# Patient Record
Sex: Female | Born: 1940 | Race: White | Hispanic: No | Smoking: Never smoker
Health system: Southern US, Community
[De-identification: ages and names within clinical notes are randomized; demographics above are authoritative.]

## PROBLEM LIST (undated history)

## (undated) DIAGNOSIS — K219 Gastro-esophageal reflux disease without esophagitis: Secondary | ICD-10-CM

## (undated) DIAGNOSIS — M109 Gout, unspecified: Secondary | ICD-10-CM

## (undated) DIAGNOSIS — E785 Hyperlipidemia, unspecified: Secondary | ICD-10-CM

## (undated) DIAGNOSIS — J45909 Unspecified asthma, uncomplicated: Secondary | ICD-10-CM

## (undated) DIAGNOSIS — I10 Essential (primary) hypertension: Secondary | ICD-10-CM

## (undated) HISTORY — PX: ABDOMINAL HYSTERECTOMY: SHX81

---

## 2004-08-19 ENCOUNTER — Ambulatory Visit: Payer: Self-pay | Admitting: Internal Medicine

## 2005-08-31 ENCOUNTER — Ambulatory Visit: Payer: Self-pay | Admitting: Internal Medicine

## 2006-09-01 ENCOUNTER — Ambulatory Visit: Payer: Self-pay | Admitting: Internal Medicine

## 2007-01-16 ENCOUNTER — Ambulatory Visit: Payer: Self-pay | Admitting: Unknown Physician Specialty

## 2008-01-01 ENCOUNTER — Ambulatory Visit: Payer: Self-pay | Admitting: Internal Medicine

## 2008-10-17 ENCOUNTER — Ambulatory Visit: Payer: Self-pay | Admitting: Internal Medicine

## 2009-01-06 ENCOUNTER — Ambulatory Visit: Payer: Self-pay | Admitting: Internal Medicine

## 2009-09-25 ENCOUNTER — Ambulatory Visit: Payer: Self-pay

## 2009-10-07 ENCOUNTER — Ambulatory Visit: Payer: Self-pay

## 2010-01-01 ENCOUNTER — Ambulatory Visit: Payer: Self-pay | Admitting: Internal Medicine

## 2010-01-18 ENCOUNTER — Inpatient Hospital Stay: Payer: Self-pay | Admitting: Internal Medicine

## 2010-01-26 ENCOUNTER — Ambulatory Visit: Payer: Self-pay | Admitting: Internal Medicine

## 2010-06-18 ENCOUNTER — Ambulatory Visit: Payer: Self-pay | Admitting: Family Medicine

## 2010-07-10 ENCOUNTER — Emergency Department: Payer: Self-pay | Admitting: Emergency Medicine

## 2011-03-08 ENCOUNTER — Ambulatory Visit: Payer: Self-pay | Admitting: Family Medicine

## 2012-04-05 ENCOUNTER — Ambulatory Visit: Payer: Self-pay | Admitting: Family Medicine

## 2012-07-25 LAB — COMPREHENSIVE METABOLIC PANEL
Anion Gap: 12 (ref 7–16)
BUN: 20 mg/dL — ABNORMAL HIGH (ref 7–18)
Bilirubin,Total: 0.5 mg/dL (ref 0.2–1.0)
Chloride: 102 mmol/L (ref 98–107)
Co2: 23 mmol/L (ref 21–32)
Creatinine: 1.13 mg/dL (ref 0.60–1.30)
EGFR (African American): 57 — ABNORMAL LOW
EGFR (Non-African Amer.): 49 — ABNORMAL LOW
Osmolality: 279 (ref 275–301)
Potassium: 3.3 mmol/L — ABNORMAL LOW (ref 3.5–5.1)
SGPT (ALT): 63 U/L (ref 12–78)
Sodium: 137 mmol/L (ref 136–145)
Total Protein: 8.1 g/dL (ref 6.4–8.2)

## 2012-07-25 LAB — PRO B NATRIURETIC PEPTIDE: B-Type Natriuretic Peptide: 906 pg/mL — ABNORMAL HIGH (ref 0–125)

## 2012-07-25 LAB — CK TOTAL AND CKMB (NOT AT ARMC): CK, Total: 72 U/L (ref 21–215)

## 2012-07-25 LAB — URINALYSIS, COMPLETE
Hyaline Cast: 3
Ketone: NEGATIVE
Leukocyte Esterase: NEGATIVE
Nitrite: NEGATIVE
Ph: 5 (ref 4.5–8.0)
Protein: NEGATIVE
Specific Gravity: 1.011 (ref 1.003–1.030)
WBC UR: 4 /HPF (ref 0–5)

## 2012-07-25 LAB — CBC
HGB: 14.2 g/dL (ref 12.0–16.0)
Platelet: 146 10*3/uL — ABNORMAL LOW (ref 150–440)
RBC: 4.63 10*6/uL (ref 3.80–5.20)

## 2012-07-25 LAB — TSH: Thyroid Stimulating Horm: 1.35 u[IU]/mL

## 2012-07-25 LAB — TROPONIN I: Troponin-I: <0.02 ng/mL

## 2012-07-26 ENCOUNTER — Inpatient Hospital Stay: Payer: Self-pay | Admitting: Internal Medicine

## 2012-07-26 LAB — CBC WITH DIFFERENTIAL/PLATELET
Basophil #: 0 10*3/uL (ref 0.0–0.1)
Eosinophil #: 0.2 10*3/uL (ref 0.0–0.7)
HCT: 37.9 % (ref 35.0–47.0)
HGB: 13.1 g/dL (ref 12.0–16.0)
Lymphocyte #: 0.3 10*3/uL — ABNORMAL LOW (ref 1.0–3.6)
Lymphocyte %: 2 %
MCH: 30.5 pg (ref 26.0–34.0)
MCHC: 34.4 g/dL (ref 32.0–36.0)
MCV: 89 fL (ref 80–100)
Monocyte #: 0.7 x10 3/mm (ref 0.2–0.9)
Neutrophil #: 13.9 10*3/uL — ABNORMAL HIGH (ref 1.4–6.5)
Neutrophil %: 92.1 %

## 2012-07-26 LAB — COMPREHENSIVE METABOLIC PANEL
Albumin: 3.3 g/dL — ABNORMAL LOW (ref 3.4–5.0)
Anion Gap: 9 (ref 7–16)
BUN: 18 mg/dL (ref 7–18)
Bilirubin,Total: 0.4 mg/dL (ref 0.2–1.0)
Chloride: 103 mmol/L (ref 98–107)
Co2: 24 mmol/L (ref 21–32)
Creatinine: 1.09 mg/dL (ref 0.60–1.30)
EGFR (African American): 59 — ABNORMAL LOW
EGFR (Non-African Amer.): 51 — ABNORMAL LOW
Osmolality: 278 (ref 275–301)
Potassium: 3.6 mmol/L (ref 3.5–5.1)
SGPT (ALT): 48 U/L (ref 12–78)
Sodium: 136 mmol/L (ref 136–145)
Total Protein: 7.5 g/dL (ref 6.4–8.2)

## 2012-07-26 LAB — RAPID INFLUENZA A&B ANTIGENS

## 2012-07-26 LAB — TROPONIN I: Troponin-I: <0.02 ng/mL

## 2012-07-26 LAB — CK TOTAL AND CKMB (NOT AT ARMC)
CK, Total: 69 U/L (ref 21–215)
CK-MB: <0.5 ng/mL — ABNORMAL LOW (ref 0.5–3.6)

## 2012-08-01 LAB — CULTURE, BLOOD (SINGLE)

## 2013-04-12 ENCOUNTER — Ambulatory Visit: Payer: Self-pay | Admitting: Family Medicine

## 2013-05-11 ENCOUNTER — Ambulatory Visit: Payer: Self-pay | Admitting: Family Medicine

## 2013-11-29 ENCOUNTER — Ambulatory Visit: Payer: Self-pay | Admitting: Family Medicine

## 2014-05-06 ENCOUNTER — Ambulatory Visit: Payer: Self-pay | Admitting: Family Medicine

## 2014-07-18 ENCOUNTER — Ambulatory Visit: Payer: Self-pay | Admitting: Physician Assistant

## 2014-11-26 NOTE — Consult Note (Signed)
General Aspect 74 yo female with history of hypertension and hyperlipidemia and reactive airway disease with gerd and hiatal hernia. She was admitted after developping rather sudden onset shortness of breath and palpitatioins. She presented to the er where she was noted to have afib with rvr. She converted to sinus thryhm. She urled out for an mi with normal troponin. Her bnp was mildly elevated to 950 ranges. Her chest xray dis not reval any evidence of volumer overload and was mildly hyperexpanded. She improved with medical therapy. Echo revealed preserved lv funciton with mild pulmonary hyupertension. She is not aware of sleep apnea but her freind states that she snores on occasion. She denis chest pain. She denies prioir afib.   Physical Exam:   GEN no acute distress    HEENT PERRL, hearing intact to voice    NECK supple  No masses    RESP normal resp effort  clear BS    CARD Regular rate and rhythm  No murmur    ABD denies tenderness  normal BS    LYMPH negative neck, negative axillae    EXTR negative cyanosis/clubbing, negative edema    SKIN normal to palpation    NEURO cranial nerves intact, motor/sensory function intact    PSYCH A+O to time, place, person   Review of Systems:   Subjective/Chief Complaint shortenss of breath    General: No Complaints    Skin: No Complaints    ENT: No Complaints    Eyes: No Complaints    Neck: No Complaints    Respiratory: Short of breath    Cardiovascular: No Complaints    Gastrointestinal: No Complaints    Genitourinary: No Complaints    Vascular: No Complaints    Musculoskeletal: No Complaints    Neurologic: No Complaints    Hematologic: No Complaints    Endocrine: No Complaints    Psychiatric: No Complaints    Review of Systems: All other systems were reviewed and found to be negative    Medications/Allergies Reviewed Medications/Allergies reviewed   Home Medications: Medication Instructions Status  K-Tab  10 mEq oral tablet, extended release 1 tab(s) orally once a day Active  furosemide 20 mg oral tablet 1 tab(s) orally once a day Active  levofloxacin 250 mg oral tablet 1 tab(s) orally once a day Active  Combivent Respimat CFC free 100 mcg-20 mcg/inh inhalation aerosol 1 puff(s) inhaled 4 times a day Active  budesonide-formoterol 160 mcg-4.5 mcg/inh inhalation aerosol 2  inhaled in the morning and at bedtime Active  metoprolol tartrate 25 mg oral tablet 1 tab(s) orally 2 times a day Active  lisinopril 2.5 mg oral tablet 1 tab(s) orally once a day Active  allopurinol 100 mg oral tablet 1 tab(s) orally once a day Active  colchicine 0.6 mg oral tablet 1 tab(s) orally once a day Active  calcium-vitamin D 500 mg-200 intl units oral tablet 1 tab(s) orally once a day Active  Centrum Silver oral tablet 1 tab(s) orally once a day Active  aspirin 81 mg oral tablet 1 tab(s) orally once a day Active  pravastatin 10 mg oral tablet 1 tab(s) orally once a day  Active  vitamin E 400 intl units oral capsule 1 cap(s) orally once a day  Active   EKG:   EKG NSR    Abnormal NSSTTW changes    penicillins: Swelling, Rash    Impression Pt with history of hypertension, hyperlipidemia admitted iwth transient afib with rvr. Etiology unclear. No evidence of left sided  failure on cxr and echo revealed preserved lv function. Mild right sided pressure increase which may be idopathic vs secondary to sleep apnea vs reactive airway disease. Appears stable at present with no evidence of chf. OK for discharge on current meds.    Plan 1. Continue current meds. 2Low fat, low sodiuim diet. 3. OK for discharge.  4. Will be glad to follow up as outpatient if desired.   Electronic Signatures: Dalia Heading (MD)  (Signed 19-Dec-13 15:36)  Authored: General Aspect/Present Illness, History and Physical Exam, Review of System, Home Medications, EKG , Allergies, Impression/Plan   Last Updated: 19-Dec-13 15:36 by Dalia Heading (MD)

## 2014-11-29 NOTE — Discharge Summary (Signed)
PATIENT NAME:  Laurie Adkins, Laurie Adkins MR#:  [id number] DATE OF BIRTH:  Nov 25, 1940  DATE OF ADMISSION:  07/26/2012 DATE OF DISCHARGE:  07/27/2012  ADMITTING DIAGNOSIS: Dyspnea.   DISCHARGE DIAGNOSES:  1. Acute respiratory distress due to asthma exacerbation.  2. Acute bronchitis.  3. Congestive heart failure, acute, diastolic.  4. Atrial fibrillation/rapid ventricular response converted to sinus rhythm now.  5. Leukocytosis.  6. Diabetes mellitus, hemoglobin A1c 6.5, new onset.  7. Hypertension.  8. Hyperlipidemia. 9. Gout.   DISCHARGE CONDITION: Stable.   DISCHARGE MEDICATIONS: The patient is to continue: 1. Pravastatin 10 mg p.o. daily.  2. Vitamin E 400 units daily.  3. Allopurinol 100 mg p.o. daily.  4. Colchicine 0.6 mg p.o. daily.  5. Calcium with vitamin D 500/200 one tablet once daily. 6. Centrum Silver once daily.  7. Aspirin 81 mg a day.  8. New medication: Lisinopril 2.5 mg p.o. daily.  9. Metoprolol tartrate 25 mg p.o. twice daily.  10. Budesonide formoterol 160/4.5 mg 2 inhalations twice daily.  11. Combivent Respimat 1 puff 4 times daily.  12. Levofloxacin 250 mg p.o. daily. The patient'Adkins levofloxacin will be for the next 4 days.  13. Furosemide 20 mg p.o. daily.  14. Potassium chloride 10 mEq p.o. daily.  15. The patient is not to take hydrochlorothiazide.  DIET: Two grams salt, low fat, low cholesterol, carbohydrate controlled diet, regular consistency.   ACTIVITY LIMITATIONS: As tolerated.   FOLLOWUP: Follow-up appointment with Dr. Gavin PottersGrandis in two days after discharge, as well as Dr. Lady GaryFath in two weeks after discharge.   CONSULTANTS: Dr. Lady GaryFath, Care Management.   RADIOLOGIC STUDIES: Chest x-ray PA and lateral on 07/25/2012 revealed no acute cardiopulmonary disease, hyperinflation consistent with COPD was noted. CT of chest with IV contrast for pulmonary embolism on 07/25/2012 showed no evidence of pulmonary embolus, otherwise unremarkable. Abdominal ultrasound  07/25/2012 showed no cholelithiasis with sonographic evidence of acute cholecystitis noted. Echocardiogram done on the 07/26/2012 showed left ventricular systolic function normal, ejection fraction equal or more than 55%, left atrial size is normal, right atrial size is normal, right ventricle was grossly normal size, there is trace tricuspid regurgitation, there is trace mitral regurgitation, aortic valve opens well, right ventricular systolic pressure is elevated at 30 to 40 mmHg.  HISTORY OF PRESENT ILLNESS: The patient is a 74 year old Caucasian female with past medical history significant for history of remote asthma presenting to the hospital with complaints of shortness of breath. Please refer to Dr. Arlys JohnVaickute'Adkins admission on 07/25/2012. On arrival to the hospital, the patient'Adkins vitals revealed a temperature of 97.9, pulse 105, pulse  was 72, blood pressure was 144/50, saturation was 90% on room air. Physical exam was unremarkable except for diminished breath sounds bilaterally especially posteriorly. Poor air entrance was noted to be bilaterally, but no significant wheezing was noted on auscultation. Otherwise, unremarkable physical exam. The patient'Adkins lab data on 07/25/2012 showed glucose of 148, BUN of 20, potassium 3.3. Otherwise BMP was unremarkable. The patient'Adkins magnesium level was found to be low at 1.4. Hemoglobin A1c was found to be 6.5. Beta natriuretic peptide was 906. Liver enzymes revealed a mildly elevated AST, 50  and alkaline phosphatase was elevated to 140. Cardiac enzymes x 1 as well as subsequent two more sets were within normal limits. TSH was normal at 1.35. White blood cell count was elevated to 16.0, hemoglobin was 14.2, platelet count 146. D-dimer was elevated at 1.21. Blood cultures taken on 07/26/2012 showed no growth. Influenza test was also  negative. Urine analysis was unremarkable except 4 white blood cells and 2 red blood cells were noted. No epithelial cells, no leukocyte  esterase or nitrites were noted. EKG showed sinus tachy at 104 beats per minute, left atrial enlargement, possible anterior infarct age undetermined. No acute ST-T changes were noted.   HOSPITAL COURSE: The patient was admitted to the hospital. She was started on inhalation therapy initially and diuretics as well as ACE inhibitor because of concern of combination of COPD versus CHF exacerbation. With this therapy, she improved; however, on 07/26/2012, she developed high fevers. Blood cultures taken during her high fever period were negative. However, the patient was started on antibiotic therapy. She was continued on antibiotics while she was in the hospital and improved, her fever subsided. She is to continue antibiotics to complete a 7-day course. In regards to her dyspnea, it was felt that the patient'Adkins dyspnea was related to combination of factors. First, asthma exacerbation as well as acute bronchitis which gave patient fevers; however, they were not present during her admission. However, the patient'Adkins white blood cell count was elevated and the patient is to continue antibiotic therapy for a few more days to complete course. Her white blood cell count checked on the day of discharge was within normal limits. The patient is to continue inhalation therapy with Combivent as well as Symbicort and follow up with her primary care physician for further recommendations.   The patient developed atrial fibrillation while in the hospital with rapid ventricular response. She was started on metoprolol and she converted into sinus rhythm a few hours after atrial fibrillation started. She remained in sinus rhythm. She is to continue metoprolol and follow up with cardiologist for further recommendations. Echocardiogram was unremarkable except for elevated right-sided pressures. It was unclear why the patient right-sided pressures were elevated; however, obstructive sleep apnea could be implicated. We did CT scan of her  chest which showed no pulmonary embolism. The patient is to continue ACE inhibitor for congestive heart failure, acute, diastolic. She is also to continue metoprolol as well as diuretics. She is to follow up with cardiologist for further recommendations.   In regards to leukocytosis, it was felt to be bronchitis related. The patient is to continue antibiotic therapy for a few more days to complete course. On day of discharge, 07/27/2012, the patient'Adkins white blood cell count is back to normal.   The patient'Adkins platelet count was also found to be low. On the day of admission, the patient'Adkins platelet count was 146. On day of discharge, it was 129. It is recommended to follow the patient'Adkins platelet count as an outpatient and make sure that the patient'Adkins platelet count returns back to normal.   The patient was noted to be hyperglycemic on arrival to the hospital. The patient'Adkins hemoglobin A1c was found to be 6.5. The patient is to be evaluated by diabetic nurse. She was advised in regards to diet control as well as weight loss. She was not prescribed any p.o. medications at this point. She is recommended to return back to her primary care physician, Dr. Gavin Potters, any decisions about initiation of oral diabetic medications if needed.   For hypertension, the patient'Adkins blood pressure was satisfactorily controlled. However, as the patient'Adkins blood pressure was slightly elevated on admission, the patient was initiated on ACE inhibitors as well as beta blockers. She is to continue those medications upon discharge. On day of discharge, the patient'Adkins blood pressure fluctuates; however, it is in the lower range and  ranging from 90s to 110s. It is recommended to follow the patient'Adkins blood pressure readings and make decisions about changing her medications if needed.   For hyperlipidemia as well as gout, she is to continue her outpatient medications. The patient is being discharged in stable condition with the above-mentioned  medications and followup. On day of discharge, temperature is 97.9, pulse ranging from 70s to 80s, respiration rate was 18, blood pressure ranging from 90s to 110s, oxygen saturation 95% on room air at rest and 93% on room air on exertion.    TIME SPENT: 40 minutes.   ____________________________ Katharina Caper, MD rv:es D: 07/27/2012 16:28:00 ET T: 07/28/2012 08:17:54 ET JOB#: [id number]  cc: Letitia Caul, MD Darlin Priestly. Lady Gary, MD Katharina Caper, MD, <Dictator>   Victorino Fatzinger MD ELECTRONICALLY SIGNED 08/20/2012 13:29

## 2014-11-29 NOTE — H&P (Signed)
PATIENT NAME:  Laurie Adkins, Laurie Adkins MR#:  [id number] DATE OF BIRTH:  March 15, 1941  DATE OF ADMISSION:  07/25/2012  PRIMARY CARE PHYSICIAN:  Dr. Gavin Potters.   HISTORY OF PRESENT ILLNESS:  The patient is a 74 year old Caucasian female with a past medical history significant for history of hypertension, hyperlipidemia, history of gastroesophageal reflux disease as well as hiatal hernia, who presented to the hospital with complaints of shortness of breath. Apparently, the patient was doing well up until yesterday. She was sitting and watching TV, and suddenly started feeling as if her heart was beating really fast. She then became very short of breath. She was also complaining of some discomfort in her epigastrium. Pain was described as dull achy pain, lasts approximately 30 minutes. However, intermittently it was coming back and forth all night long. She decided to come to the Emergency Room, as today in the morning, she was still very short of breath as well as wheezing. She also developed significant diaphoresis and burst into cold sweats earlier today. Because of this painful respiration which the patient was describing to me as well as shortness of breath, she decided to come to the Emergency Room for further evaluation and hospitalist services were contacted for admission. She denies any heart problems in the past or lung problems in the past, however, admits of having asthma exacerbation approximately 17 years ago. No smoking, passive or active during all her lifetime.   PAST MEDICAL HISTORY:  Significant for history of admission in June 2011 for PENICILLIN ALLERGY, hypertension, hyperlipidemia, history of leukocytosis, gastroesophageal reflux disease, hiatal hernia, left carotid bruit.   PAST SURGICAL HISTORY: Tonsillectomy as well as adenoidectomy in 1979, vaginal hysterectomy secondary to increased bleeding.   FAMILY HISTORY: The patient's mother had history of high blood pressure, pacemaker placement, as  well as CVA. The patient's sister had breast cancer. Brother died of pancreatic cancer.  Another brother died of MI at the age of 55.   SOCIAL HISTORY: Never smoked, retired from [employer]. She is divorced. No alcohol abuse.   MEDICATIONS: Allopurinol 100 mg p.o. daily, aspirin 81 mg p.o. daily, calcium with vitamin D daily, multivitamins daily, HCTZ 25 mg p.o. daily, Pravachol 10 mg p.o. daily and vitamin E 400 units daily.   ALLERGIES: AART FROM PENICILLIN APPARENTLY INCLUDES ALSO PREDNISONE WHICH CAUSES SWELLING.   REVIEW OF SYSTEMS:  Positive for feeling cold sweats, short of breath, as well as epigastric abdominal discomfort, wheezing in her lungs, painful respirations, some lower chest pains, epigastric pains, arrhythmias, palpitations earlier today as well as dyspnea on exertion, nausea and vomiting earlier this morning as well as epigastric upper abdominal pain. The patient was  recently treated for urinary tract infection and finished therapy with nitrofurantoin. Denies any fevers, chills, fatigue, weakness, weight loss or gain.  IN REGARDS TO EYES: Denies any blurry vision, double vision, glaucoma or cataracts.  ENT: Denies any tinnitus, allergies, epistaxis, sinus pain, dentures, difficulty swallowing.  RESPIRATORY:  Denies any cough, asthma, COPD.   CARDIOVASCULAR: Denies any orthopnea, edema, palpitations or syncope.  GASTROINTESTINAL:  Denies any diarrhea, rectal bleeding, change in bowel habits.  GENITOURINARY: Denies dysuria, hematuria, frequency or incontinence.  ENDOCRINOLOGY: Denies any polydipsia, nocturia, thyroid problems, heat or cold intolerance, or thirst.  HEMATOLOGIC: Denies anemia, easy bruising, or bleeding, glands.  SKIN: Denies any acne, rashes, lesions or moles.  MUSCULOSKELETAL: Denies arthritis, cramps, swelling.  NEUROLOGIC: No numbness, epilepsy or tremor.  PSYCHIATRIC: Denies anxiety, insomnia or depression.   PHYSICAL EXAMINATION: VITAL  SIGNS: On  arrival to the hospital, temperature is 97.9, pulse 105, respirations were 72, blood pressure 144/50, saturation was 90% on room air.  GENERAL: This is a well-developed, well-nourished, mildly obese Caucasian female in no significant distress, sitting on the stretcher.  HEENT: Her pupils are equal and reactive to light. Extraocular muscles intact.  No icterus or conjunctivitis. Has normal hearing. No pharyngeal erythema. Mucosa is moist.  NECK: No masses, supple, nontender. Thyroid is not enlarged. No adenopathy. No JVD or carotid bruits bilaterally. Full range of motion.  LUNGS: Diminished breath sounds bilaterally, especially posteriorly. Poor air entrance bilaterally. However, the patient does have some wheezing I can hear from the distance.  She does have increased effort to breathe. She is tachypneic, in minimal respiratory distress I would say. She was also noted to have rales and crackles at bases bilaterally, especially posteriorly.   CARDIOVASCULAR: S1, S2 appreciated. No murmurs, gallops, or rubs noted. PMI is not enlarged. Chest is nontender to palpation.  EXTREMITIES:  Pulses 1+.  No lower extremity edema, calf tenderness or cyanosis was noted.  ABDOMEN: Soft, nontender. Bowel sounds are present. No hepatosplenomegaly or masses were noted.   RECTAL: Deferred.  MUSCULOSKELETAL: Able to move all extremities. No cyanosis, degenerative joint disease or kyphosis suggested.  Marland Kitchen.  SKIN: Did not reveal and did not reveal any rashes, lesions, erythema, nodularity, induration. It was warm and dry to palpation.  LYMPH: No lymphadenopathy in the cervical region.  NEUROLOGICAL: Cranial nerves grossly intact. Sensory is intact. No dysarthria or aphasia.  PSYCH: The patient is alert, oriented to time, person and place, cooperative. Memory is good. No recent confusion, agitation or depression noted.   LABORATORY DATA: BMP showed glucose 148, BUN was 20, potassium 3.3, otherwise BMP was unremarkable,  however, patient's estimated GFR for non-African American would be 49 which is lower than normal. The beta-natriuretic peptide was elevated at 906. Liver enzymes showed alkaline phosphatase 140, AST elevated to 50, otherwise unremarkable study. The patient's cardiac enzymes, first set is negative. White blood cell count is 16.0, hemoglobin 14.2, platelet count 146. EKG showed sinus tachycardia at 104 beats per minute, normal axis, possible left atrial enlargement, possible anterior infarct with poor RV progression in V4, no acute ST-T changes, however, were noted.   RADIOLOGIC STUDIES:  Chest x-ray, PA and lateral, on July 25, 2012, revealed no acute cardiopulmonary disease. Hyperinflation consistent with COPD noted.   ASSESSMENT AND PLAN: 1.  Dyspnea of unclear etiology at this time. Admit the patient to the medical floor. Will treat patient for possible congestive heart failure as well as chronic obstructive pulmonary disease exacerbation. Admit, will continue oxygen therapy with DuoNebs as well as Lasix at low dose, and will follow patient's clinical presentation.  2.  Hypoxia. Will continue oxygen as needed.  3.  Palpitations. Get TSH, will also check d-dimer and will get a CT scan of her chest done to rule out pulmonary embolism. Will follow on telemetry, for now, it is normal sinus rhythm on EKG.  4.  Epigastric abdominal pain, questionable acute coronary syndrome. Get Myoview in the morning.   TIME SPENT: 50 minutes.     ____________________________ Katharina Caperima Milda Lindvall, MD rv:cs D: 07/25/2012 17:48:00 ET T: 07/25/2012 18:45:43 ET JOB#: [id number]  cc: Letitia CaulHeidi M. Grandis, MD Katharina Caperima Lien Lyman, MD, <Dictator>  Xinyi Batton MD ELECTRONICALLY SIGNED 08/30/2012 13:56

## 2015-05-14 ENCOUNTER — Other Ambulatory Visit: Payer: Self-pay | Admitting: Family Medicine

## 2015-05-14 DIAGNOSIS — R945 Abnormal results of liver function studies: Secondary | ICD-10-CM

## 2015-05-14 DIAGNOSIS — Z0389 Encounter for observation for other suspected diseases and conditions ruled out: Secondary | ICD-10-CM

## 2015-05-14 DIAGNOSIS — R7989 Other specified abnormal findings of blood chemistry: Secondary | ICD-10-CM

## 2015-05-19 ENCOUNTER — Ambulatory Visit
Admission: RE | Admit: 2015-05-19 | Discharge: 2015-05-19 | Disposition: A | Discharge status: Home / Self Care | Payer: Medicare Other | Source: Ambulatory Visit | Attending: Family Medicine | Admitting: Family Medicine

## 2015-05-19 DIAGNOSIS — R7989 Other specified abnormal findings of blood chemistry: Secondary | ICD-10-CM | POA: Diagnosis present

## 2015-05-19 DIAGNOSIS — R945 Abnormal results of liver function studies: Secondary | ICD-10-CM

## 2015-05-19 DIAGNOSIS — Z0389 Encounter for observation for other suspected diseases and conditions ruled out: Secondary | ICD-10-CM | POA: Insufficient documentation

## 2015-09-24 ENCOUNTER — Other Ambulatory Visit: Payer: Self-pay | Admitting: Family Medicine

## 2015-09-24 DIAGNOSIS — R928 Other abnormal and inconclusive findings on diagnostic imaging of breast: Secondary | ICD-10-CM

## 2015-10-07 ENCOUNTER — Ambulatory Visit
Admission: EM | Admit: 2015-10-07 | Discharge: 2015-10-07 | Disposition: A | Discharge status: Home / Self Care | Payer: Medicare Other | Attending: Family Medicine | Admitting: Family Medicine

## 2015-10-07 ENCOUNTER — Encounter: Payer: Self-pay | Admitting: *Deleted

## 2015-10-07 DIAGNOSIS — J0100 Acute maxillary sinusitis, unspecified: Secondary | ICD-10-CM | POA: Diagnosis not present

## 2015-10-07 HISTORY — DX: Essential (primary) hypertension: I10

## 2015-10-07 MED ORDER — AZITHROMYCIN 250 MG PO TABS: ORAL_TABLET | ORAL | Status: DC

## 2015-10-07 NOTE — ED Notes (Signed)
Pt c/o runny nose, watery eyes, sore throat, mild non-productive cough. Past hx of seasonal allergies. Denies fever.

## 2015-10-07 NOTE — ED Provider Notes (Signed)
CSN: [id number]     Arrival date & time 10/07/15  2440 History   First MD Initiated Contact with Patient 10/07/15 1011     Chief Complaint  Patient presents with  . Nasal Congestion  . Sore Throat   (Consider location/radiation/quality/duration/timing/severity/associated sxs/prior Treatment) Patient is a 75 y.o. female presenting with pharyngitis and URI. The history is provided by the patient.  Sore Throat Associated symptoms include headaches.  URI Presenting symptoms: congestion, cough, facial pain, fatigue and fever   Severity:  Moderate Onset quality:  Sudden Duration:  2 weeks Timing:  Constant Progression:  Worsening Chronicity:  New Relieved by:  Nothing Ineffective treatments:  OTC medications Associated symptoms: headaches and sinus pain   Associated symptoms: no wheezing   Risk factors: sick contacts   Risk factors: no chronic cardiac disease, no chronic kidney disease, no chronic respiratory disease, no diabetes mellitus, no immunosuppression, no recent illness and no recent travel     Past Medical History  Diagnosis Date  . Hypertension    History reviewed. No pertinent past surgical history. History reviewed. No pertinent family history. Social History  Substance Use Topics  . Smoking status: Never Smoker   . Smokeless tobacco: None  . Alcohol Use: No   OB History    No data available     Review of Systems  Constitutional: Positive for fever and fatigue.  HENT: Positive for congestion.   Respiratory: Positive for cough. Negative for wheezing.   Neurological: Positive for headaches.    Allergies  Penicillins  Home Medications   Prior to Admission medications   Medication Sig Start Date End Date Taking? Authorizing Provider  losartan (COZAAR) 25 MG tablet Take 25 mg by mouth daily.   Yes Historical Provider, MD  pravastatin (PRAVACHOL) 40 MG tablet Take 40 mg by mouth daily.   Yes Historical Provider, MD  azithromycin (ZITHROMAX Z-PAK) 250 MG  tablet 2 tabs po once day 1, then 1 tab po qd for next 4 days 10/07/15   Payton Mccallum, MD   Meds Ordered and Administered this Visit  Medications - No data to display  BP 134/68 mmHg  Pulse 60  Temp(Src) 97.7 F (36.5 C) (Oral)  Resp 16  Ht 5' (1.524 m)  Wt 135 lb (61.236 kg)  BMI 26.37 kg/m2  SpO2 98% No data found.   Physical Exam  Constitutional: She appears well-developed and well-nourished. No distress.  HENT:  Head: Normocephalic and atraumatic.  Right Ear: Tympanic membrane, external ear and ear canal normal.  Left Ear: Tympanic membrane, external ear and ear canal normal.  Nose: Mucosal edema and rhinorrhea present. No nose lacerations, sinus tenderness, nasal deformity, septal deviation or nasal septal hematoma. No epistaxis.  No foreign bodies. Right sinus exhibits maxillary sinus tenderness and frontal sinus tenderness. Left sinus exhibits maxillary sinus tenderness and frontal sinus tenderness.  Mouth/Throat: Uvula is midline, oropharynx is clear and moist and mucous membranes are normal. No oropharyngeal exudate.  Eyes: Conjunctivae and EOM are normal. Pupils are equal, round, and reactive to light. Right eye exhibits no discharge. Left eye exhibits no discharge. No scleral icterus.  Neck: Normal range of motion. Neck supple. No thyromegaly present.  Cardiovascular: Normal rate, regular rhythm and normal heart sounds.   Pulmonary/Chest: Effort normal and breath sounds normal. No respiratory distress. She has no wheezes. She has no rales.  Lymphadenopathy:    She has no cervical adenopathy.  Skin: She is not diaphoretic.  Nursing note and vitals reviewed.  ED Course  Procedures (including critical care time)  Labs Review Labs Reviewed - No data to display  Imaging Review No results found.   Visual Acuity Review  Right Eye Distance:   Left Eye Distance:   Bilateral Distance:    Right Eye Near:   Left Eye Near:    Bilateral Near:         MDM    1. Acute maxillary sinusitis, recurrence not specified    Discharge Medication List as of 10/07/2015 10:31 AM    START taking these medications   Details  azithromycin (ZITHROMAX Z-PAK) 250 MG tablet 2 tabs po once day 1, then 1 tab po qd for next 4 days, Normal      1. diagnosis reviewed with patient 2. rx as per orders above; reviewed possible side effects, interactions, risks and benefits  3. Recommend supportive treatment with increased fluids, otc analgesics 4. Follow-up prn if symptoms worsen or don't improve    Payton Mccallum, MD 10/07/15 1101

## 2015-10-10 ENCOUNTER — Ambulatory Visit
Admission: RE | Admit: 2015-10-10 | Discharge: 2015-10-10 | Disposition: A | Discharge status: Home / Self Care | Payer: Medicare Other | Source: Ambulatory Visit | Attending: Family Medicine | Admitting: Family Medicine

## 2015-10-10 ENCOUNTER — Other Ambulatory Visit: Payer: Self-pay | Admitting: Family Medicine

## 2015-10-10 DIAGNOSIS — R928 Other abnormal and inconclusive findings on diagnostic imaging of breast: Secondary | ICD-10-CM

## 2015-10-10 DIAGNOSIS — N63 Unspecified lump in breast: Secondary | ICD-10-CM | POA: Diagnosis not present

## 2016-02-01 ENCOUNTER — Emergency Department
Admission: EM | Admit: 2016-02-01 | Discharge: 2016-02-01 | Disposition: A | Discharge status: Home / Self Care | Payer: Medicare Other | Attending: Emergency Medicine | Admitting: Emergency Medicine

## 2016-02-01 ENCOUNTER — Encounter: Payer: Self-pay | Admitting: Emergency Medicine

## 2016-02-01 DIAGNOSIS — Z79899 Other long term (current) drug therapy: Secondary | ICD-10-CM | POA: Diagnosis not present

## 2016-02-01 DIAGNOSIS — E785 Hyperlipidemia, unspecified: Secondary | ICD-10-CM | POA: Diagnosis not present

## 2016-02-01 DIAGNOSIS — T7840XA Allergy, unspecified, initial encounter: Secondary | ICD-10-CM | POA: Diagnosis not present

## 2016-02-01 DIAGNOSIS — I10 Essential (primary) hypertension: Secondary | ICD-10-CM | POA: Diagnosis not present

## 2016-02-01 DIAGNOSIS — R21 Rash and other nonspecific skin eruption: Secondary | ICD-10-CM

## 2016-02-01 HISTORY — DX: Hyperlipidemia, unspecified: E78.5

## 2016-02-01 MED ORDER — PREDNISONE 20 MG PO TABS: 40.0000 mg | ORAL_TABLET | Freq: Every day | ORAL | Status: DC

## 2016-02-01 MED ORDER — METHYLPREDNISOLONE SODIUM SUCC 125 MG IJ SOLR
125.0000 mg | Freq: Once | INTRAMUSCULAR | Status: AC
  Administered 2016-02-01: 125 mg via INTRAVENOUS
  Filled 2016-02-01: qty 2

## 2016-02-01 MED ORDER — DIPHENHYDRAMINE HCL 50 MG/ML IJ SOLN
25.0000 mg | Freq: Once | INTRAMUSCULAR | Status: AC
  Administered 2016-02-01: 25 mg via INTRAVENOUS
  Filled 2016-02-01: qty 1

## 2016-02-01 NOTE — ED Notes (Signed)
Patient brought in by [facility] from home for allergic reaction. Patient states that on 12/23/15 she was stung by about 6-8 bees, patient was seen by her PCP the next day and received a 2 shots, patient is not sure exactly what she received. On Thursday patient developed a rash all over her body, rash is itchy, not painful. This morning the rash seemed wrose and patient felt like she was having difficulty breathing. Patient used her epipen at home.

## 2016-02-01 NOTE — ED Provider Notes (Signed)
[facility] Emergency Department Provider Note    ____________________________________________  Time seen: ~1150  I have reviewed the triage vital signs and the nursing notes.   HISTORY  Chief Complaint Rash   History limited by: Not Limited   HPI Laurie Adkins is a 75 y.o. female who presents to the emergency room for rash and concern for allergic reaction. The patient started having a rash 3 days ago. She says at that time she started taking some Benadryl and seemed to be getting better. Today however the rash was worse. She does describe it as being itchy. She also felt like she was having trouble breathing or throat swelling. She injected herself with epinephrine which has helped with some of her breathing difficulty. She started Macrobid 3 days prior to the rash first starting secondary to UTI. She currently feels like her urinary tract symptoms have improved.    Past Medical History  Diagnosis Date  . Hypertension   . Hyperlipemia     There are no active problems to display for this patient.   Past Surgical History  Procedure Laterality Date  . Abdominal hysterectomy      Current Outpatient Rx  Name  Route  Sig  Dispense  Refill  . azithromycin (ZITHROMAX Z-PAK) 250 MG tablet      2 tabs po once day 1, then 1 tab po qd for next 4 days   6 each   0   . losartan (COZAAR) 25 MG tablet   Oral   Take 25 mg by mouth daily.         . pravastatin (PRAVACHOL) 40 MG tablet   Oral   Take 40 mg by mouth daily.           Allergies Penicillins  Family History  Problem Relation Age of Onset  . Breast cancer Sister 3330    Social History Social History  Substance Use Topics  . Smoking status: Never Smoker   . Smokeless tobacco: None  . Alcohol Use: No    Review of Systems  Constitutional: Negative for fever. Cardiovascular: Negative for chest pain. Respiratory: Negative for shortness of breath. Gastrointestinal:  Negative for abdominal pain, vomiting and diarrhea. Genitourinary: Negative for dysuria. Musculoskeletal: Negative for back pain. Skin: Positive for rash Neurological: Negative for headaches, focal weakness or numbness.   10-point ROS otherwise negative.  ____________________________________________   PHYSICAL EXAM:  VITAL SIGNS: ED Triage Vitals  Enc Vitals Group     BP 02/01/16 1118 152/51 mmHg     Pulse Rate 02/01/16 1118 107     Resp 02/01/16 1118 16     Temp 02/01/16 1118 98.5 F (36.9 C)     Temp Source 02/01/16 1118 Oral     SpO2 02/01/16 1118 94 %     Weight 02/01/16 1118 137 lb 5.6 oz (62.3 kg)     Height 02/01/16 1118 5\' 1"  (1.549 m)   Constitutional: Alert and oriented. Well appearing and in no distress. Eyes: Conjunctivae are normal. PERRL. Normal extraocular movements. ENT   Head: Normocephalic and atraumatic.   Nose: No congestion/rhinnorhea.   Mouth/Throat: Mucous membranes are moist.   Neck: No stridor. Hematological/Lymphatic/Immunilogical: No cervical lymphadenopathy. Cardiovascular: Normal rate, regular rhythm.  No murmurs, rubs, or gallops. Respiratory: Normal respiratory effort without tachypnea nor retractions. Breath sounds are clear and equal bilaterally. No wheezes/rales/rhonchi. Gastrointestinal: Soft and nontender. No distention. There is no CVA tenderness. Genitourinary: Deferred Musculoskeletal: Normal range of motion in all extremities. No  joint effusions.  No lower extremity tenderness nor edema. Neurologic:  Normal speech and language. No gross focal neurologic deficits are appreciated.  Skin:  Diffuse maculopapular rash over the patient's whole body. Psychiatric: Mood and affect are normal. Speech and behavior are normal. Patient exhibits appropriate insight and judgment.  ____________________________________________    LABS (pertinent  positives/negatives)  None ____________________________________________   EKG  None  ____________________________________________    RADIOLOGY  None  ____________________________________________   PROCEDURES  Procedure(s) performed: None  Critical Care performed: No  ____________________________________________   INITIAL IMPRESSION / ASSESSMENT AND PLAN / ED COURSE  Pertinent labs & imaging results that were available during my care of the patient were reviewed by me and considered in my medical decision making (see chart for details).  Patient here after a rash and taking an epipen.  On exam diffuse rash. No stridor, no respiratory distress. Will give solumedrol and benadryl.   Patient was observed in the emergency department for 4 hours. Patient did not have any recurrence of difficulty with breathing or swelling in her throat. The rash did appear improved. Will give patient prescription for prednisone. Patient states she still has EpiPen's. Discussed return precautions.  ____________________________________________   FINAL CLINICAL IMPRESSION(S) / ED DIAGNOSES  Final diagnoses:  Rash  Allergic reaction, initial encounter     Note: This dictation was prepared with Dragon dictation. Any transcriptional errors that result from this process are unintentional    Phineas SemenGraydon Hedaya Latendresse, MD 02/01/16 1626

## 2016-02-01 NOTE — ED Notes (Signed)
Patient currently on Macrobid for UTI

## 2016-02-01 NOTE — ED Notes (Signed)
Patient states that she was started on antibiotic for a UTI when she was seen on 01/23/16. Unsure of the name of the antibiotic. Patient uses [facility] in [place].

## 2016-02-01 NOTE — Discharge Instructions (Signed)
Please seek medical attention for any high fevers, chest pain, shortness of breath, change in behavior, persistent vomiting, bloody stool or any other new or concerning symptoms. ° ° °Allergies °An allergy is when your body reacts to a substance in a way that is not normal. An allergic reaction can happen after you: °· Eat something. °· Breathe in something. °· Touch something. °WHAT KINDS OF ALLERGIES ARE THERE? °You can be allergic to: °· Things that are only around during certain seasons, like molds and pollens. °· Foods. °· Drugs. °· Insects. °· Animal dander. °WHAT ARE SYMPTOMS OF ALLERGIES? °· Puffiness (swelling). This may happen on the lips, face, tongue, mouth, or throat. °· Sneezing. °· Coughing. °· Breathing loudly (wheezing). °· Stuffy nose. °· Tingling in the mouth. °· A rash. °· Itching. °· Itchy, red, puffy areas of skin (hives). °· Watery eyes. °· Throwing up (vomiting). °· Watery poop (diarrhea). °· Dizziness. °· Feeling faint or fainting. °· Trouble breathing or swallowing. °· A tight feeling in the chest. °· A fast heartbeat. °HOW ARE ALLERGIES DIAGNOSED? °Allergies can be diagnosed with: °· A medical and family history. °· Skin tests. °· Blood tests. °· A food diary. A food diary is a record of all the foods, drinks, and symptoms you have each day. °· The results of an elimination diet. This diet involves making sure not to eat certain foods and then seeing what happens when you start eating them again. °HOW ARE ALLERGIES TREATED? °There is no cure for allergies, but allergic reactions can be treated with medicine. Severe reactions usually need to be treated at a hospital.  °HOW CAN REACTIONS BE PREVENTED? °The best way to prevent an allergic reaction is to avoid the thing you are allergic to. Allergy shots and medicines can also help prevent reactions in some cases. °  °This information is not intended to replace advice given to you by your health care provider. Make sure you discuss any  questions you have with your health care provider. °  °Document Released: 11/20/2012 Document Revised: 08/16/2014 Document Reviewed: 05/07/2014 °Elsevier Interactive Patient Education ©2016 [employer] ° °

## 2016-09-06 ENCOUNTER — Other Ambulatory Visit: Payer: Self-pay | Admitting: Family Medicine

## 2016-09-06 DIAGNOSIS — Z1231 Encounter for screening mammogram for malignant neoplasm of breast: Secondary | ICD-10-CM

## 2016-10-11 ENCOUNTER — Ambulatory Visit
Admission: RE | Admit: 2016-10-11 | Discharge: 2016-10-11 | Disposition: A | Discharge status: Home / Self Care | Payer: Medicare Other | Source: Ambulatory Visit | Attending: Family Medicine | Admitting: Family Medicine

## 2016-10-11 DIAGNOSIS — Z1231 Encounter for screening mammogram for malignant neoplasm of breast: Secondary | ICD-10-CM

## 2017-01-27 NOTE — Discharge Instructions (Signed)

## 2017-02-02 ENCOUNTER — Encounter
Admission: RE | Disposition: A | Discharge status: Home / Self Care | Payer: Self-pay | Source: Ambulatory Visit | Attending: Ophthalmology

## 2017-02-02 ENCOUNTER — Ambulatory Visit: Payer: Medicare Other | Admitting: Anesthesiology

## 2017-02-02 ENCOUNTER — Ambulatory Visit
Admission: RE | Admit: 2017-02-02 | Discharge: 2017-02-02 | Disposition: A | Discharge status: Home / Self Care | Payer: Medicare Other | Source: Ambulatory Visit | Attending: Ophthalmology | Admitting: Ophthalmology

## 2017-02-02 DIAGNOSIS — Z880 Allergy status to penicillin: Secondary | ICD-10-CM | POA: Insufficient documentation

## 2017-02-02 DIAGNOSIS — J45909 Unspecified asthma, uncomplicated: Secondary | ICD-10-CM | POA: Insufficient documentation

## 2017-02-02 DIAGNOSIS — K219 Gastro-esophageal reflux disease without esophagitis: Secondary | ICD-10-CM | POA: Insufficient documentation

## 2017-02-02 DIAGNOSIS — M109 Gout, unspecified: Secondary | ICD-10-CM | POA: Diagnosis not present

## 2017-02-02 DIAGNOSIS — E7800 Pure hypercholesterolemia, unspecified: Secondary | ICD-10-CM | POA: Diagnosis not present

## 2017-02-02 DIAGNOSIS — H2511 Age-related nuclear cataract, right eye: Secondary | ICD-10-CM | POA: Insufficient documentation

## 2017-02-02 DIAGNOSIS — Z90710 Acquired absence of both cervix and uterus: Secondary | ICD-10-CM | POA: Diagnosis not present

## 2017-02-02 DIAGNOSIS — I10 Essential (primary) hypertension: Secondary | ICD-10-CM | POA: Insufficient documentation

## 2017-02-02 HISTORY — PX: CATARACT EXTRACTION W/PHACO: SHX586

## 2017-02-02 HISTORY — DX: Gout, unspecified: M10.9

## 2017-02-02 HISTORY — DX: Gastro-esophageal reflux disease without esophagitis: K21.9

## 2017-02-02 HISTORY — DX: Unspecified asthma, uncomplicated: J45.909

## 2017-02-02 SURGERY — PHACOEMULSIFICATION, CATARACT, WITH IOL INSERTION
Anesthesia: Monitor Anesthesia Care | Laterality: Right | Wound class: Clean

## 2017-02-02 MED ORDER — EPINEPHRINE PF 1 MG/ML IJ SOLN
INTRAOCULAR | Status: DC | PRN
  Administered 2017-02-02: 61 mL via OPHTHALMIC

## 2017-02-02 MED ORDER — MOXIFLOXACIN HCL 0.5 % OP SOLN
OPHTHALMIC | Status: DC | PRN
  Administered 2017-02-02: .2 mL via OPHTHALMIC

## 2017-02-02 MED ORDER — LIDOCAINE HCL (PF) 2 % IJ SOLN
INTRAOCULAR | Status: DC | PRN
  Administered 2017-02-02: 1 mL via INTRAOCULAR

## 2017-02-02 MED ORDER — LACTATED RINGERS IV SOLN: 1000.0000 mL | INTRAVENOUS | Status: DC

## 2017-02-02 MED ORDER — ARMC OPHTHALMIC DILATING DROPS
1.0000 "application " | OPHTHALMIC | Status: DC | PRN
  Administered 2017-02-02 (×3): 1 via OPHTHALMIC

## 2017-02-02 MED ORDER — NA HYALUR & NA CHOND-NA HYALUR 0.4-0.35 ML IO KIT
PACK | INTRAOCULAR | Status: DC | PRN
  Administered 2017-02-02: 1 mL via INTRAOCULAR

## 2017-02-02 MED ORDER — MOXIFLOXACIN HCL 0.5 % OP SOLN
1.0000 [drp] | OPHTHALMIC | Status: DC | PRN
  Administered 2017-02-02 (×3): 1 [drp] via OPHTHALMIC

## 2017-02-02 MED ORDER — BRIMONIDINE TARTRATE-TIMOLOL 0.2-0.5 % OP SOLN
OPHTHALMIC | Status: DC | PRN
  Administered 2017-02-02: 1 [drp] via OPHTHALMIC

## 2017-02-02 MED ORDER — MIDAZOLAM HCL 2 MG/2ML IJ SOLN
INTRAMUSCULAR | Status: DC | PRN
  Administered 2017-02-02: 2 mg via INTRAVENOUS

## 2017-02-02 MED ORDER — FENTANYL CITRATE (PF) 100 MCG/2ML IJ SOLN
INTRAMUSCULAR | Status: DC | PRN
  Administered 2017-02-02: 50 ug via INTRAVENOUS

## 2017-02-02 SURGICAL SUPPLY — 25 items
CANNULA ANT/CHMB 27GA (MISCELLANEOUS) ×3 IMPLANT
CARTRIDGE ABBOTT (MISCELLANEOUS) IMPLANT
GLOVE SURG LX 7.5 STRW (GLOVE) ×2
GLOVE SURG LX STRL 7.5 STRW (GLOVE) ×1 IMPLANT
GLOVE SURG TRIUMPH 8.0 PF LTX (GLOVE) ×3 IMPLANT
GOWN STRL REUS W/ TWL LRG LVL3 (GOWN DISPOSABLE) ×2 IMPLANT
GOWN STRL REUS W/TWL LRG LVL3 (GOWN DISPOSABLE) ×4
LENS IOL TECNIS ITEC 22.5 (Intraocular Lens) ×3 IMPLANT
MARKER SKIN DUAL TIP RULER LAB (MISCELLANEOUS) ×3 IMPLANT
NDL RETROBULBAR .5 NSTRL (NEEDLE) IMPLANT
NEEDLE FILTER BLUNT 18X 1/2SAF (NEEDLE) ×2
NEEDLE FILTER BLUNT 18X1 1/2 (NEEDLE) ×1 IMPLANT
PACK CATARACT BRASINGTON (MISCELLANEOUS) ×3 IMPLANT
PACK EYE AFTER SURG (MISCELLANEOUS) ×3 IMPLANT
PACK OPTHALMIC (MISCELLANEOUS) ×3 IMPLANT
RING MALYGIN 7.0 (MISCELLANEOUS) IMPLANT
SUT ETHILON 10-0 CS-B-6CS-B-6 (SUTURE)
SUT VICRYL  9 0 (SUTURE)
SUT VICRYL 9 0 (SUTURE) IMPLANT
SUTURE EHLN 10-0 CS-B-6CS-B-6 (SUTURE) IMPLANT
SYR 3ML LL SCALE MARK (SYRINGE) ×3 IMPLANT
SYR 5ML LL (SYRINGE) ×3 IMPLANT
SYR TB 1ML LUER SLIP (SYRINGE) ×3 IMPLANT
WATER STERILE IRR 250ML POUR (IV SOLUTION) ×3 IMPLANT
WIPE NON LINTING 3.25X3.25 (MISCELLANEOUS) ×3 IMPLANT

## 2017-02-02 NOTE — Anesthesia Procedure Notes (Signed)
Procedure Name: MAC Performed by: Mayme Genta Pre-anesthesia Checklist: Patient identified, Emergency Drugs available, Suction available, Timeout performed and Patient being monitored Patient Re-evaluated:Patient Re-evaluated prior to inductionOxygen Delivery Method: Nasal cannula Placement Confirmation: positive ETCO2

## 2017-02-02 NOTE — Transfer of Care (Signed)
Immediate Anesthesia Transfer of Care Note  Patient: Laurie Adkins  Procedure(s) Performed: Procedure(s): CATARACT EXTRACTION PHACO AND INTRAOCULAR LENS PLACEMENT (IOC)  right (Right)  Patient Location: PACU  Anesthesia Type: MAC  Level of Consciousness: awake, alert  and patient cooperative  Airway and Oxygen Therapy: Patient Spontanous Breathing and Patient connected to supplemental oxygen  Post-op Assessment: Post-op Vital signs reviewed, Patient's Cardiovascular Status Stable, Respiratory Function Stable, Patent Airway and No signs of Nausea or vomiting  Post-op Vital Signs: Reviewed and stable  Complications: No apparent anesthesia complications

## 2017-02-02 NOTE — Anesthesia Postprocedure Evaluation (Signed)
Anesthesia Post Note  Patient: Laurie Adkins  Procedure(s) Performed: Procedure(s) (LRB): CATARACT EXTRACTION PHACO AND INTRAOCULAR LENS PLACEMENT (IOC)  right (Right)  Patient location during evaluation: PACU Anesthesia Type: MAC Level of consciousness: awake Pain management: pain level controlled Vital Signs Assessment: post-procedure vital signs reviewed and stable Respiratory status: spontaneous breathing Cardiovascular status: blood pressure returned to baseline Postop Assessment: no headache Anesthetic complications: no    Lavonna Monarch

## 2017-02-02 NOTE — Op Note (Signed)
LOCATION:  [facility]   PREOPERATIVE DIAGNOSIS:    Nuclear sclerotic cataract right eye. H25.11   POSTOPERATIVE DIAGNOSIS:  Nuclear sclerotic cataract right eye.     PROCEDURE:  Phacoemusification with posterior chamber intraocular lens placement of the right eye   LENS:   Implant Name Type Inv. Item Serial No. Manufacturer Lot No. LRB No. Used  LENS IOL DIOP 22.5 - [id number] Intraocular Lens LENS IOL DIOP 22.5 [id number]   Right 1        ULTRASOUND TIME: 16 % of 1 minutes, 49 seconds.  CDE 17.5   SURGEON:  Deirdre Evenerhadwick R. Cage Gupton, MD   ANESTHESIA:  Topical with tetracaine drops and 2% Xylocaine jelly, augmented with 1% preservative-free intracameral lidocaine.    COMPLICATIONS:  None.   DESCRIPTION OF PROCEDURE:  The patient was identified in the holding room and transported to the operating room and placed in the supine position under the operating microscope.  The right eye was identified as the operative eye and it was prepped and draped in the usual sterile ophthalmic fashion.   A 1 millimeter clear-corneal paracentesis was made at the 12:00 position.  0.5 ml of preservative-free 1% lidocaine was injected into the anterior chamber. The anterior chamber was filled with Viscoat viscoelastic.  A 2.4 millimeter keratome was used to make a near-clear corneal incision at the 9:00 position.  A curvilinear capsulorrhexis was made with a cystotome and capsulorrhexis forceps.  Balanced salt solution was used to hydrodissect and hydrodelineate the nucleus.   Phacoemulsification was then used in stop and chop fashion to remove the lens nucleus and epinucleus.  The remaining cortex was then removed using the irrigation and aspiration handpiece. Provisc was then placed into the capsular bag to distend it for lens placement.  A lens was then injected into the capsular bag.  The remaining viscoelastic was aspirated.   Wounds were hydrated with balanced salt solution.  The anterior  chamber was inflated to a physiologic pressure with balanced salt solution.  No wound leaks were noted. Vigamox 0.2 ml of a 1mg  per ml solution was injected into the anterior chamber for a dose of 0.2 mg of intracameral antibiotic at the completion of the case.   Timolol and Brimonidine drops were applied to the eye.  The patient was taken to the recovery room in stable condition without complications of anesthesia or surgery.   Saifullah Jolley 02/02/2017, 9:38 AM

## 2017-02-02 NOTE — Anesthesia Preprocedure Evaluation (Signed)
Anesthesia Evaluation  Patient identified by MRN, date of birth, ID band Patient awake    Reviewed: Allergy & Precautions, NPO status , Patient's Chart, lab work & pertinent test results, reviewed documented beta blocker date and time   Airway Mallampati: I  TM Distance: >3 FB Neck ROM: Full    Dental no notable dental hx.    Pulmonary asthma ,    Pulmonary exam normal breath sounds clear to auscultation       Cardiovascular hypertension, Normal cardiovascular exam Rhythm:Regular Rate:Normal     Neuro/Psych negative neurological ROS  negative psych ROS   GI/Hepatic Neg liver ROS, GERD  ,  Endo/Other  negative endocrine ROS  Renal/GU negative Renal ROS  negative genitourinary   Musculoskeletal negative musculoskeletal ROS (+)   Abdominal Normal abdominal exam  (+)  Abdomen: soft.    Peds  Hematology negative hematology ROS (+)   Anesthesia Other Findings   Reproductive/Obstetrics negative OB ROS                             Anesthesia Physical Anesthesia Plan  ASA: II  Anesthesia Plan: MAC   Post-op Pain Management:    Induction:   PONV Risk Score and Plan:   Airway Management Planned:   Additional Equipment: None  Intra-op Plan:   Post-operative Plan:   Informed Consent: I have reviewed the patients History and Physical, chart, labs and discussed the procedure including the risks, benefits and alternatives for the proposed anesthesia with the patient or authorized representative who has indicated his/her understanding and acceptance.     Plan Discussed with: CRNA, Anesthesiologist and Surgeon  Anesthesia Plan Comments:         Anesthesia Quick Evaluation

## 2017-02-02 NOTE — H&P (Signed)
The History and Physical notes are on paper, have been signed, and are to be scanned. The patient remains stable and unchanged from the H&P.   Previous H&P reviewed, patient examined, and there are no changes.  Laurie Adkins 02/02/2017 8:13 AM

## 2017-02-03 ENCOUNTER — Encounter: Payer: Self-pay | Admitting: Ophthalmology

## 2017-05-05 ENCOUNTER — Encounter: Payer: Self-pay | Admitting: *Deleted

## 2017-05-05 NOTE — Discharge Instructions (Signed)

## 2017-05-30 ENCOUNTER — Encounter: Payer: Self-pay | Admitting: *Deleted

## 2017-06-01 ENCOUNTER — Ambulatory Visit: Payer: Medicare Other | Admitting: Anesthesiology

## 2017-06-01 ENCOUNTER — Ambulatory Visit
Admission: RE | Admit: 2017-06-01 | Discharge: 2017-06-01 | Disposition: A | Discharge status: Home / Self Care | Payer: Medicare Other | Source: Ambulatory Visit | Attending: Ophthalmology | Admitting: Ophthalmology

## 2017-06-01 ENCOUNTER — Encounter
Admission: RE | Disposition: A | Discharge status: Home / Self Care | Payer: Self-pay | Source: Ambulatory Visit | Attending: Ophthalmology

## 2017-06-01 DIAGNOSIS — H2512 Age-related nuclear cataract, left eye: Secondary | ICD-10-CM | POA: Diagnosis not present

## 2017-06-01 HISTORY — PX: CATARACT EXTRACTION W/PHACO: SHX586

## 2017-06-01 SURGERY — PHACOEMULSIFICATION, CATARACT, WITH IOL INSERTION
Anesthesia: Monitor Anesthesia Care | Laterality: Left | Wound class: Clean

## 2017-06-01 MED ORDER — ACETAMINOPHEN 160 MG/5ML PO SOLN: 325.0000 mg | Freq: Once | ORAL | Status: DC

## 2017-06-01 MED ORDER — FENTANYL CITRATE (PF) 100 MCG/2ML IJ SOLN
INTRAMUSCULAR | Status: DC | PRN
  Administered 2017-06-01: 50 ug via INTRAVENOUS

## 2017-06-01 MED ORDER — BRIMONIDINE TARTRATE-TIMOLOL 0.2-0.5 % OP SOLN
OPHTHALMIC | Status: DC | PRN
  Administered 2017-06-01: 1 [drp] via OPHTHALMIC

## 2017-06-01 MED ORDER — MIDAZOLAM HCL 2 MG/2ML IJ SOLN
INTRAMUSCULAR | Status: DC | PRN
  Administered 2017-06-01: 2 mg via INTRAVENOUS

## 2017-06-01 MED ORDER — LIDOCAINE HCL (PF) 2 % IJ SOLN
INTRAMUSCULAR | Status: DC | PRN
  Administered 2017-06-01: 1 mL via INTRAMUSCULAR

## 2017-06-01 MED ORDER — MOXIFLOXACIN HCL 0.5 % OP SOLN
OPHTHALMIC | Status: DC | PRN
  Administered 2017-06-01: 0.2 mL via OPHTHALMIC

## 2017-06-01 MED ORDER — ARMC OPHTHALMIC DILATING DROPS
1.0000 "application " | OPHTHALMIC | Status: DC | PRN
  Administered 2017-06-01 (×3): 1 via OPHTHALMIC

## 2017-06-01 MED ORDER — LACTATED RINGERS IV SOLN: INTRAVENOUS | Status: DC

## 2017-06-01 MED ORDER — EPINEPHRINE PF 1 MG/ML IJ SOLN
INTRAMUSCULAR | Status: DC | PRN
  Administered 2017-06-01: 64 mL via OPHTHALMIC

## 2017-06-01 MED ORDER — NA HYALUR & NA CHOND-NA HYALUR 0.4-0.35 ML IO KIT
PACK | INTRAOCULAR | Status: DC | PRN
  Administered 2017-06-01: 1 mL via INTRAOCULAR

## 2017-06-01 MED ORDER — ACETAMINOPHEN 325 MG PO TABS: 325.0000 mg | ORAL_TABLET | Freq: Once | ORAL | Status: DC

## 2017-06-01 MED ORDER — MOXIFLOXACIN HCL 0.5 % OP SOLN
1.0000 [drp] | OPHTHALMIC | Status: DC | PRN
  Administered 2017-06-01 (×3): 1 [drp] via OPHTHALMIC

## 2017-06-01 SURGICAL SUPPLY — 25 items
CANNULA ANT/CHMB 27GA (MISCELLANEOUS) ×3 IMPLANT
CARTRIDGE ABBOTT (MISCELLANEOUS) IMPLANT
GLOVE SURG LX 7.5 STRW (GLOVE) ×2
GLOVE SURG LX STRL 7.5 STRW (GLOVE) ×1 IMPLANT
GLOVE SURG TRIUMPH 8.0 PF LTX (GLOVE) ×3 IMPLANT
GOWN STRL REUS W/ TWL LRG LVL3 (GOWN DISPOSABLE) ×2 IMPLANT
GOWN STRL REUS W/TWL LRG LVL3 (GOWN DISPOSABLE) ×4
LENS IOL TECNIS ITEC 22.5 (Intraocular Lens) ×3 IMPLANT
MARKER SKIN DUAL TIP RULER LAB (MISCELLANEOUS) ×3 IMPLANT
NDL RETROBULBAR .5 NSTRL (NEEDLE) IMPLANT
NEEDLE FILTER BLUNT 18X 1/2SAF (NEEDLE) ×2
NEEDLE FILTER BLUNT 18X1 1/2 (NEEDLE) ×1 IMPLANT
PACK CATARACT BRASINGTON (MISCELLANEOUS) ×3 IMPLANT
PACK EYE AFTER SURG (MISCELLANEOUS) ×3 IMPLANT
PACK OPTHALMIC (MISCELLANEOUS) ×3 IMPLANT
RING MALYGIN 7.0 (MISCELLANEOUS) IMPLANT
SUT ETHILON 10-0 CS-B-6CS-B-6 (SUTURE)
SUT VICRYL  9 0 (SUTURE)
SUT VICRYL 9 0 (SUTURE) IMPLANT
SUTURE EHLN 10-0 CS-B-6CS-B-6 (SUTURE) IMPLANT
SYR 3ML LL SCALE MARK (SYRINGE) ×3 IMPLANT
SYR 5ML LL (SYRINGE) ×3 IMPLANT
SYR TB 1ML LUER SLIP (SYRINGE) ×3 IMPLANT
WATER STERILE IRR 250ML POUR (IV SOLUTION) ×3 IMPLANT
WIPE NON LINTING 3.25X3.25 (MISCELLANEOUS) ×3 IMPLANT

## 2017-06-01 NOTE — Anesthesia Preprocedure Evaluation (Signed)
Anesthesia Evaluation  Patient identified by MRN, date of birth, ID band Patient awake    Reviewed: Allergy & Precautions, H&P , NPO status , Patient's Chart, lab work & pertinent test results, reviewed documented beta blocker date and time   Airway Mallampati: II  TM Distance: >3 FB Neck ROM: full    Dental no notable dental hx.    Pulmonary asthma ,    Pulmonary exam normal breath sounds clear to auscultation       Cardiovascular hypertension, Normal cardiovascular exam Rhythm:regular Rate:Normal     Neuro/Psych negative neurological ROS  negative psych ROS   GI/Hepatic Neg liver ROS, GERD  ,  Endo/Other  negative endocrine ROS  Renal/GU negative Renal ROS  negative genitourinary   Musculoskeletal negative musculoskeletal ROS (+)   Abdominal Normal abdominal exam  (+)  Abdomen: soft.    Peds  Hematology negative hematology ROS (+)   Anesthesia Other Findings   Reproductive/Obstetrics negative OB ROS                             Anesthesia Physical  Anesthesia Plan  ASA: II  Anesthesia Plan: MAC   Post-op Pain Management:    Induction:   PONV Risk Score and Plan: 2 and Midazolam  Airway Management Planned:   Additional Equipment: None  Intra-op Plan:   Post-operative Plan:   Informed Consent: I have reviewed the patients History and Physical, chart, labs and discussed the procedure including the risks, benefits and alternatives for the proposed anesthesia with the patient or authorized representative who has indicated his/her understanding and acceptance.     Plan Discussed with: CRNA  Anesthesia Plan Comments:         Anesthesia Quick Evaluation

## 2017-06-01 NOTE — H&P (Signed)
The History and Physical notes are on paper, have been signed, and are to be scanned. The patient remains stable and unchanged from the H&P.   Previous H&P reviewed, patient examined, and there are no changes.  Laurie Adkins 06/01/2017 10:54 AM

## 2017-06-01 NOTE — Anesthesia Postprocedure Evaluation (Signed)
Anesthesia Post Note  Patient: Laurie Adkins  Procedure(s) Performed: CATARACT EXTRACTION PHACO AND INTRAOCULAR LENS PLACEMENT (IOC) LEFT (Left )  Patient location during evaluation: PACU Anesthesia Type: MAC Level of consciousness: awake and alert and oriented Pain management: satisfactory to patient Vital Signs Assessment: post-procedure vital signs reviewed and stable Respiratory status: spontaneous breathing, nonlabored ventilation and respiratory function stable Cardiovascular status: blood pressure returned to baseline and stable Postop Assessment: Adequate PO intake and No signs of nausea or vomiting Anesthetic complications: no    Raliegh Ip

## 2017-06-01 NOTE — Transfer of Care (Signed)
Immediate Anesthesia Transfer of Care Note  Patient: Laurie Adkins  Procedure(s) Performed: CATARACT EXTRACTION PHACO AND INTRAOCULAR LENS PLACEMENT (IOC) LEFT (Left )  Patient Location: PACU  Anesthesia Type: MAC  Level of Consciousness: awake, alert  and patient cooperative  Airway and Oxygen Therapy: Patient Spontanous Breathing and Patient connected to supplemental oxygen  Post-op Assessment: Post-op Vital signs reviewed, Patient's Cardiovascular Status Stable, Respiratory Function Stable, Patent Airway and No signs of Nausea or vomiting  Post-op Vital Signs: Reviewed and stable  Complications: No apparent anesthesia complications

## 2017-06-01 NOTE — Anesthesia Procedure Notes (Signed)
Procedure Name: MAC Performed by: Mayme Genta Pre-anesthesia Checklist: Patient identified, Emergency Drugs available, Suction available, Timeout performed and Patient being monitored Patient Re-evaluated:Patient Re-evaluated prior to induction Oxygen Delivery Method: Nasal cannula Placement Confirmation: positive ETCO2

## 2017-06-01 NOTE — Op Note (Signed)
OPERATIVE NOTE  Laurie Adkins [id number] 06/01/2017   PREOPERATIVE DIAGNOSIS:  Nuclear sclerotic cataract left eye. H25.12   POSTOPERATIVE DIAGNOSIS:    Nuclear sclerotic cataract left eye.     PROCEDURE:  Phacoemusification with posterior chamber intraocular lens placement of the left eye   LENS:   Implant Name Type Inv. Item Serial No. Manufacturer Lot No. LRB No. Used  LENS IOL DIOP 22.5 - [id number] Intraocular Lens LENS IOL DIOP 22.5 [id number]   Left 1        ULTRASOUND TIME: 18  % of 1 minutes 9 seconds, CDE 12.5  SURGEON:  Deirdre Evenerhadwick R. Dequan Kindred, MD   ANESTHESIA:  Topical with tetracaine drops and 2% Xylocaine jelly, augmented with 1% preservative-free intracameral lidocaine.    COMPLICATIONS:  None.   DESCRIPTION OF PROCEDURE:  The patient was identified in the holding room and transported to the operating room and placed in the supine position under the operating microscope.  The left eye was identified as the operative eye and it was prepped and draped in the usual sterile ophthalmic fashion.   A 1 millimeter clear-corneal paracentesis was made at the 1:30 position.  0.5 ml of preservative-free 1% lidocaine was injected into the anterior chamber.  The anterior chamber was filled with Viscoat viscoelastic.  A 2.4 millimeter keratome was used to make a near-clear corneal incision at the 10:30 position.  .  A curvilinear capsulorrhexis was made with a cystotome and capsulorrhexis forceps.  Balanced salt solution was used to hydrodissect and hydrodelineate the nucleus.   Phacoemulsification was then used in stop and chop fashion to remove the lens nucleus and epinucleus.  The remaining cortex was then removed using the irrigation and aspiration handpiece. Provisc was then placed into the capsular bag to distend it for lens placement.  A lens was then injected into the capsular bag.  The remaining viscoelastic was aspirated.   Wounds were hydrated with balanced  salt solution.  The anterior chamber was inflated to a physiologic pressure with balanced salt solution.  No wound leaks were noted. Vigamox 0.2 ml of a 1mg  per ml solution was injected into the anterior chamber for a dose of 0.2 mg of intracameral antibiotic at the completion of the case.   Timolol and Brimonidine drops were applied to the eye.  The patient was taken to the recovery room in stable condition without complications of anesthesia or surgery.  [name] 06/01/2017, 12:04 PM

## 2017-06-02 ENCOUNTER — Encounter: Payer: Self-pay | Admitting: Ophthalmology

## 2017-09-06 ENCOUNTER — Other Ambulatory Visit: Payer: Self-pay | Admitting: Family Medicine

## 2017-09-06 DIAGNOSIS — Z1231 Encounter for screening mammogram for malignant neoplasm of breast: Secondary | ICD-10-CM

## 2017-10-13 ENCOUNTER — Ambulatory Visit
Admission: RE | Admit: 2017-10-13 | Discharge: 2017-10-13 | Disposition: A | Discharge status: Home / Self Care | Payer: Medicare Other | Source: Ambulatory Visit | Attending: Family Medicine | Admitting: Family Medicine

## 2017-10-13 DIAGNOSIS — Z1231 Encounter for screening mammogram for malignant neoplasm of breast: Secondary | ICD-10-CM | POA: Insufficient documentation

## 2017-11-17 ENCOUNTER — Other Ambulatory Visit: Payer: Self-pay | Admitting: Nurse Practitioner

## 2017-11-17 DIAGNOSIS — K760 Fatty (change of) liver, not elsewhere classified: Secondary | ICD-10-CM

## 2017-12-06 ENCOUNTER — Other Ambulatory Visit: Payer: Self-pay | Admitting: Nurse Practitioner

## 2017-12-06 DIAGNOSIS — R945 Abnormal results of liver function studies: Principal | ICD-10-CM

## 2017-12-06 DIAGNOSIS — R7989 Other specified abnormal findings of blood chemistry: Secondary | ICD-10-CM

## 2017-12-28 ENCOUNTER — Other Ambulatory Visit: Payer: Self-pay | Admitting: Nurse Practitioner

## 2017-12-28 DIAGNOSIS — R748 Abnormal levels of other serum enzymes: Secondary | ICD-10-CM

## 2017-12-28 DIAGNOSIS — R7989 Other specified abnormal findings of blood chemistry: Secondary | ICD-10-CM

## 2017-12-28 DIAGNOSIS — R945 Abnormal results of liver function studies: Secondary | ICD-10-CM

## 2017-12-28 DIAGNOSIS — K760 Fatty (change of) liver, not elsewhere classified: Secondary | ICD-10-CM

## 2018-01-09 ENCOUNTER — Ambulatory Visit
Admission: RE | Admit: 2018-01-09 | Discharge: 2018-01-09 | Disposition: A | Discharge status: Home / Self Care | Payer: Medicare Other | Source: Ambulatory Visit | Attending: Nurse Practitioner | Admitting: Nurse Practitioner

## 2018-01-09 DIAGNOSIS — K760 Fatty (change of) liver, not elsewhere classified: Secondary | ICD-10-CM

## 2018-01-09 DIAGNOSIS — R945 Abnormal results of liver function studies: Secondary | ICD-10-CM | POA: Insufficient documentation

## 2018-01-09 DIAGNOSIS — R7989 Other specified abnormal findings of blood chemistry: Secondary | ICD-10-CM

## 2018-01-09 DIAGNOSIS — R748 Abnormal levels of other serum enzymes: Secondary | ICD-10-CM

## 2018-07-24 ENCOUNTER — Other Ambulatory Visit: Payer: Self-pay | Admitting: Family Medicine

## 2018-07-24 DIAGNOSIS — Z1231 Encounter for screening mammogram for malignant neoplasm of breast: Secondary | ICD-10-CM

## 2018-07-24 DIAGNOSIS — Z780 Asymptomatic menopausal state: Secondary | ICD-10-CM

## 2018-10-06 ENCOUNTER — Emergency Department
Admission: EM | Admit: 2018-10-06 | Discharge: 2018-10-06 | Disposition: A | Discharge status: Home / Self Care | Payer: Medicare Other | Attending: Emergency Medicine | Admitting: Emergency Medicine

## 2018-10-06 ENCOUNTER — Emergency Department: Payer: Medicare Other

## 2018-10-06 ENCOUNTER — Ambulatory Visit (INDEPENDENT_AMBULATORY_CARE_PROVIDER_SITE_OTHER)
Admission: EM | Admit: 2018-10-06 | Discharge: 2018-10-06 | Disposition: A | Discharge status: Home / Self Care | Payer: Medicare Other | Source: Home / Self Care | Attending: Family Medicine | Admitting: Family Medicine

## 2018-10-06 ENCOUNTER — Encounter: Payer: Self-pay | Admitting: Emergency Medicine

## 2018-10-06 ENCOUNTER — Other Ambulatory Visit: Payer: Self-pay

## 2018-10-06 DIAGNOSIS — I10 Essential (primary) hypertension: Secondary | ICD-10-CM

## 2018-10-06 DIAGNOSIS — W1809XA Striking against other object with subsequent fall, initial encounter: Secondary | ICD-10-CM

## 2018-10-06 DIAGNOSIS — S63617A Unspecified sprain of left little finger, initial encounter: Secondary | ICD-10-CM | POA: Insufficient documentation

## 2018-10-06 DIAGNOSIS — W01198A Fall on same level from slipping, tripping and stumbling with subsequent striking against other object, initial encounter: Secondary | ICD-10-CM | POA: Insufficient documentation

## 2018-10-06 DIAGNOSIS — Y990 Civilian activity done for income or pay: Secondary | ICD-10-CM | POA: Diagnosis not present

## 2018-10-06 DIAGNOSIS — S0083XA Contusion of other part of head, initial encounter: Secondary | ICD-10-CM | POA: Insufficient documentation

## 2018-10-06 DIAGNOSIS — Z79899 Other long term (current) drug therapy: Secondary | ICD-10-CM | POA: Insufficient documentation

## 2018-10-06 DIAGNOSIS — Y9289 Other specified places as the place of occurrence of the external cause: Secondary | ICD-10-CM | POA: Diagnosis not present

## 2018-10-06 DIAGNOSIS — S022XXA Fracture of nasal bones, initial encounter for closed fracture: Secondary | ICD-10-CM | POA: Insufficient documentation

## 2018-10-06 DIAGNOSIS — Y9389 Activity, other specified: Secondary | ICD-10-CM | POA: Diagnosis not present

## 2018-10-06 DIAGNOSIS — S6992XA Unspecified injury of left wrist, hand and finger(s), initial encounter: Secondary | ICD-10-CM | POA: Diagnosis not present

## 2018-10-06 DIAGNOSIS — S0992XA Unspecified injury of nose, initial encounter: Secondary | ICD-10-CM | POA: Diagnosis present

## 2018-10-06 DIAGNOSIS — W19XXXA Unspecified fall, initial encounter: Secondary | ICD-10-CM

## 2018-10-06 DIAGNOSIS — J45909 Unspecified asthma, uncomplicated: Secondary | ICD-10-CM | POA: Insufficient documentation

## 2018-10-06 MED ORDER — TRAMADOL-ACETAMINOPHEN 37.5-325 MG PO TABS: 1.0000 | ORAL_TABLET | Freq: Three times a day (TID) | ORAL | 0 refills | Status: AC | PRN

## 2018-10-06 NOTE — Discharge Instructions (Signed)
Follow discharge care instructions and wear finger splint for 3 to 5 days as needed.  Call ENT clinic Monday morning to schedule appointment.  Advised them you are a follow-up from the emergency room.

## 2018-10-06 NOTE — Discharge Instructions (Signed)
Recommend patient go to Emergency Department for further evaluation and management °

## 2018-10-06 NOTE — ED Triage Notes (Addendum)
Patient complains of fall that occurred while getting tangled up with vacuum cleaner. Patient states that she believes her left hand little finger may be broken. Patient has significant swelling and pain to her face. Patient states that she fell face first on to carpeted floor.   Patient states that when she fell yesterday when she sat up her nose was bleeding, reports that today her nose will bleed if she blows it.

## 2018-10-06 NOTE — ED Provider Notes (Signed)
[facility]    CSN: [id number] Arrival date & time: 10/06/18  1009     History   Chief Complaint Chief Complaint  Patient presents with  . Fall  . Facial Pain    HPI Laurie Adkins is a 78 y.o. female.   78 yo female with a c/o fall last night landing on her face and also injuring her left hand pinky finger. States has continued with nose bleeding today. Denies loss of consciousness, vision changes, vomiting.   The history is provided by the patient.  Fall     Past Medical History:  Diagnosis Date  . Asthma   . GERD (gastroesophageal reflux disease)   . Gout   . Hyperlipemia   . Hypertension     There are no active problems to display for this patient.   Past Surgical History:  Procedure Laterality Date  . ABDOMINAL HYSTERECTOMY    . CATARACT EXTRACTION W/PHACO Right 02/02/2017   Procedure: CATARACT EXTRACTION PHACO AND INTRAOCULAR LENS PLACEMENT (IOC)  right;  Surgeon: Lockie Mola, MD;  Location: [facility];  Service: Ophthalmology;  Laterality: Right;  . CATARACT EXTRACTION W/PHACO Left 06/01/2017   Procedure: CATARACT EXTRACTION PHACO AND INTRAOCULAR LENS PLACEMENT (IOC) LEFT;  Surgeon: Lockie Mola, MD;  Location: [facility];  Service: Ophthalmology;  Laterality: Left;    OB History   No obstetric history on file.      Home Medications    Prior to Admission medications   Medication Sig Start Date End Date Taking? Authorizing Provider  allopurinol (ZYLOPRIM) 100 MG tablet Take 100 mg by mouth daily.   Yes [provider]  aspirin EC 81 MG tablet Take 81 mg by mouth daily.   Yes [provider]  Ipratropium-Albuterol (COMBIVENT) 20-100 MCG/ACT AERS respimat Inhale 1 puff into the lungs every 6 (six) hours as needed for wheezing.   Yes [provider]  losartan (COZAAR) 25 MG tablet Take 25 mg by mouth daily.   Yes [provider]  metoprolol succinate (TOPROL-XL)  25 MG 24 hr tablet Take 25 mg by mouth daily.   Yes [provider]  Multiple Vitamin (MULTIVITAMIN) tablet Take 1 tablet by mouth daily.   Yes [provider]  pravastatin (PRAVACHOL) 40 MG tablet Take 40 mg by mouth daily.   Yes [provider]  vitamin E 600 UNIT capsule Take 600 Units by mouth daily.   Yes [provider]  ranitidine (ZANTAC) 150 MG capsule Take 150 mg by mouth 2 (two) times daily.    [provider]  traMADol-acetaminophen (ULTRACET) 37.5-325 MG tablet Take 1 tablet by mouth every 8 (eight) hours as needed. 10/06/18   Joni Reining, PA-C    Family History Family History  Problem Relation Age of Onset  . Breast cancer Sister 80    Social History Social History   Tobacco Use  . Smoking status: Never Smoker  . Smokeless tobacco: Never Used  Substance Use Topics  . Alcohol use: No  . Drug use: Never     Allergies   Penicillins   Review of Systems Review of Systems   Physical Exam Triage Vital Signs ED Triage Vitals  Enc Vitals Group     BP 10/06/18 1031 (!) 164/63     Pulse Rate 10/06/18 1031 77     Resp 10/06/18 1031 16     Temp 10/06/18 1031 98 F (36.7 C)     Temp Source 10/06/18 1031 Oral  SpO2 10/06/18 1031 97 %     Weight 10/06/18 1029 129 lb (58.5 kg)     Height 10/06/18 1029 5' (1.524 m)     Head Circumference --      Peak Flow --      Pain Score 10/06/18 1029 5     Pain Loc --      Pain Edu? --      Excl. in GC? --    No data found.  Updated Vital Signs BP (!) 164/63 (BP Location: Right Arm)   Pulse 77   Temp 98 F (36.7 C) (Oral)   Resp 16   Ht 5' (1.524 m)   Wt 58.5 kg   SpO2 97%   BMI 25.19 kg/m   Visual Acuity Right Eye Distance:   Left Eye Distance:   Bilateral Distance:    Right Eye Near:   Left Eye Near:    Bilateral Near:     Physical Exam Vitals signs and nursing note reviewed.  Constitutional:      General: She is not in acute distress.    Appearance:  She is not toxic-appearing or diaphoretic.  HENT:     Head: Raccoon eyes and contusion present.     Right Ear: Tympanic membrane, ear canal and external ear normal.     Left Ear: Tympanic membrane, ear canal and external ear normal.     Nose: Nasal deformity, signs of injury and nasal tenderness present.  Neck:     Musculoskeletal: Normal range of motion and neck supple. No neck rigidity or muscular tenderness.  Cardiovascular:     Rate and Rhythm: Normal rate.  Pulmonary:     Effort: Pulmonary effort is normal. No respiratory distress.  Neurological:     Mental Status: She is alert.      UC Treatments / Results  Labs (all labs ordered are listed, but only abnormal results are displayed) Labs Reviewed - No data to display  EKG None  Radiology Dg Finger Little Left  Result Date: 10/06/2018 CLINICAL DATA:  Pain and edema. EXAM: LEFT LITTLE FINGER 2+V COMPARISON:  No prior. FINDINGS: Diffuse osteopenia and degenerative change. No acute bony abnormality. No evidence of fracture or dislocation. IMPRESSION: Diffuse osteopenia and degenerative change.  No acute abnormality. Electronically Signed   By: Maisie Fus  Register   On: 10/06/2018 12:46   Ct Maxillofacial Wo Contrast  Result Date: 10/06/2018 CLINICAL DATA:  78 year old who fell at home yesterday after tripping over her vacuum cleaner. Patient fell face first onto the carpeted floor and has facial pain and swelling. She had a nose bleed immediately after the fall. Initial encounter. EXAM: CT MAXILLOFACIAL WITHOUT CONTRAST TECHNIQUE: Multidetector CT imaging of the maxillofacial structures was performed. Multiplanar CT image reconstructions were also generated. A metallic BB was placed on the right temple in order to reliably differentiate right from left. COMPARISON:  None. FINDINGS: Osseous: Nondisplaced BILATERAL nasal bone fractures. No facial bone fractures elsewhere. Temporomandibular joints anatomically aligned with mild  degenerative changes. Orbits: No orbital fractures. No evidence of intraorbital hemorrhage. Sinuses: Mucous retention cyst or polyp involving the LEFT maxillary sinus. Minimal mucosal thickening involving the RIGHT maxillary sinus. Opacification of BILATERAL ethmoid air cells. Frontal sinuses well aerated. Small benign osteoma involving the LEFT frontal sinus. Small mucous retention cyst or polyp in the RIGHT sphenoid sinus. LEFT sphenoid sinus well aerated. Visualized BILATERAL mastoid air cells and BILATERAL middle ear cavities well-aerated. Bony nasal septal deviation to the LEFT. RIGHT concha bullosa. Soft  tissues: Mild soft tissue swelling/ecchymosis involving the cheeks bilaterally. No evidence of soft tissue hematoma. Limited intracranial: Unremarkable. IMPRESSION: 1. Nondisplaced bilateral nasal bone fractures. 2. No facial bone fractures elsewhere. 3. Mild chronic bilateral maxillary, bilateral ethmoid and right sphenoid sinusitis. 4. Bony nasal septal deviation to the left. Right concha bullosa. Electronically Signed   By: Hulan Saas M.D.   On: 10/06/2018 13:06    Procedures Procedures (including critical care time)  Medications Ordered in UC Medications - No data to display  Initial Impression / Assessment and Plan / UC Course  I have reviewed the triage vital signs and the nursing notes.  Pertinent labs & imaging results that were available during my care of the patient were reviewed by me and considered in my medical decision making (see chart for details).      Final Clinical Impressions(s) / UC Diagnoses   Final diagnoses:  Contusion of face, initial encounter  Contusion of forehead, initial encounter  Hand injury, left, initial encounter  Fall, initial encounter     Discharge Instructions     Recommend patient go to Emergency Department for further evaluation and management    ED Prescriptions    None     Controlled Substance Prescriptions [place] Controlled  Substance Registry consulted? Not Applicable   Payton Mccallum, MD 10/06/18 1400

## 2018-10-06 NOTE — ED Triage Notes (Signed)
Fell yesterday, hitting nose on floor.  STates fell after getting tangled in the vacuum hose.  Swelling to nose and ecchymosis under eyes bilaterally. Also c/o left fifth finger swelling and bruising.  Denies LOC

## 2018-10-06 NOTE — ED Notes (Signed)

## 2018-10-06 NOTE — ED Provider Notes (Signed)
[facility] Emergency Department Provider Note   ____________________________________________   First MD Initiated Contact with Patient 10/06/18 1208     (approximate)  I have reviewed the triage vital signs and the nursing notes.   HISTORY  Chief Complaint Fall    HPI Laurie Adkins is a 78 y.o. female patient presents with facial edema inferior orbital ecchymosis secondary to a fall yesterday.  Patient says she tripped over a vacuum hose at work hitting her face.  Patient denies LOC.  Patient also has pain and edema to the fifth finger left hand.  Patient rates her pain as a 5/10.  Patient denies headache, vision disturbance, or vertigo.  Patient was able to drive herself to the ED today.   Past Medical History:  Diagnosis Date  . Asthma   . GERD (gastroesophageal reflux disease)   . Gout   . Hyperlipemia   . Hypertension     There are no active problems to display for this patient.   Past Surgical History:  Procedure Laterality Date  . ABDOMINAL HYSTERECTOMY    . CATARACT EXTRACTION W/PHACO Right 02/02/2017   Procedure: CATARACT EXTRACTION PHACO AND INTRAOCULAR LENS PLACEMENT (IOC)  right;  Surgeon: Lockie Mola, MD;  Location: [facility];  Service: Ophthalmology;  Laterality: Right;  . CATARACT EXTRACTION W/PHACO Left 06/01/2017   Procedure: CATARACT EXTRACTION PHACO AND INTRAOCULAR LENS PLACEMENT (IOC) LEFT;  Surgeon: Lockie Mola, MD;  Location: [facility];  Service: Ophthalmology;  Laterality: Left;    Prior to Admission medications   Medication Sig Start Date End Date Taking? Authorizing Provider  allopurinol (ZYLOPRIM) 100 MG tablet Take 100 mg by mouth daily.    [provider]  aspirin EC 81 MG tablet Take 81 mg by mouth daily.    [provider]  Ipratropium-Albuterol (COMBIVENT) 20-100 MCG/ACT AERS respimat Inhale 1 puff into the lungs every 6 (six) hours as needed for  wheezing.    [provider]  losartan (COZAAR) 25 MG tablet Take 25 mg by mouth daily.    [provider]  metoprolol succinate (TOPROL-XL) 25 MG 24 hr tablet Take 25 mg by mouth daily.    [provider]  Multiple Vitamin (MULTIVITAMIN) tablet Take 1 tablet by mouth daily.    [provider]  pravastatin (PRAVACHOL) 40 MG tablet Take 40 mg by mouth daily.    [provider]  ranitidine (ZANTAC) 150 MG capsule Take 150 mg by mouth 2 (two) times daily.    [provider]  traMADol-acetaminophen (ULTRACET) 37.5-325 MG tablet Take 1 tablet by mouth every 8 (eight) hours as needed. 10/06/18   Joni Reining, PA-C  vitamin E 600 UNIT capsule Take 600 Units by mouth daily.    [provider]    Allergies Penicillins  Family History  Problem Relation Age of Onset  . Breast cancer Sister 51    Social History Social History   Tobacco Use  . Smoking status: Never Smoker  . Smokeless tobacco: Never Used  Substance Use Topics  . Alcohol use: No  . Drug use: Never    Review of Systems Constitutional: No fever/chills Eyes: No visual changes. ENT: No sore throat.  Nasal pain and swelling. Cardiovascular: Denies chest pain. Respiratory: Denies shortness of breath. Gastrointestinal: No abdominal pain.  No nausea, no vomiting.  No diarrhea.  No constipation. Genitourinary: Negative for dysuria. Musculoskeletal:  Pain edema left fifth finger. Skin: Negative for rash. Neurological: Negative for headaches,  focal weakness or numbness. Endocrine:  Hyperlipidemia, hypertension, and gout.    Allergic/Immunilogical: Penicillin ____________________________________________   PHYSICAL EXAM:  VITAL SIGNS: ED Triage Vitals  Enc Vitals Group     BP 10/06/18 1138 (!) 148/51     Pulse Rate 10/06/18 1138 93     Resp 10/06/18 1138 18     Temp 10/06/18 1138 98.1 F (36.7 C)     Temp Source 10/06/18 1138 Oral     SpO2 10/06/18 1138 93  %     Weight --      Height --      Head Circumference --      Peak Flow --      Pain Score 10/06/18 1146 5     Pain Loc --      Pain Edu? --      Excl. in GC? --     Constitutional: Alert and oriented. Well appearing and in no acute distress. Eyes: Conjunctivae are normal. PERRL. EOMI. Head: Atraumatic. Nose: Nasal edema. Mouth/Throat: Mucous membranes are moist.  Oropharynx non-erythematous. Neck: No cervical spine tenderness to palpation. Hematological/Lymphatic/Immunilogical: No cervical lymphadenopathy. Cardiovascular: Normal rate, regular rhythm. Grossly normal heart sounds.  Good peripheral circulation. Respiratory: Normal respiratory effort.  No retractions. Lungs CTAB. Gastrointestinal: Soft and nontender. No distention. No abdominal bruits. No CVA tenderness. Genitourinary: Deferred }Musculoskeletal: No lower extremity tenderness nor edema.  No joint effusions. Neurologic:  Normal speech and language. No gross focal neurologic deficits are appreciated. No gait instability. Skin:  Skin is warm, dry and intact. No rash noted. Psychiatric: Mood and affect are normal. Speech and behavior are normal.  ____________________________________________   LABS (all labs ordered are listed, but only abnormal results are displayed)  Labs Reviewed - No data to display ____________________________________________  EKG   ____________________________________________  RADIOLOGY  ED MD interpretation:    Official radiology report(s): Dg Finger Little Left  Result Date: 10/06/2018 CLINICAL DATA:  Pain and edema. EXAM: LEFT LITTLE FINGER 2+V COMPARISON:  No prior. FINDINGS: Diffuse osteopenia and degenerative change. No acute bony abnormality. No evidence of fracture or dislocation. IMPRESSION: Diffuse osteopenia and degenerative change.  No acute abnormality. Electronically Signed   By: Maisie Fushomas  Register   On: 10/06/2018 12:46   Ct Maxillofacial Wo Contrast  Result Date:  10/06/2018 CLINICAL DATA:  78 year old who fell at home yesterday after tripping over her vacuum cleaner. Patient fell face first onto the carpeted floor and has facial pain and swelling. She had a nose bleed immediately after the fall. Initial encounter. EXAM: CT MAXILLOFACIAL WITHOUT CONTRAST TECHNIQUE: Multidetector CT imaging of the maxillofacial structures was performed. Multiplanar CT image reconstructions were also generated. A metallic BB was placed on the right temple in order to reliably differentiate right from left. COMPARISON:  None. FINDINGS: Osseous: Nondisplaced BILATERAL nasal bone fractures. No facial bone fractures elsewhere. Temporomandibular joints anatomically aligned with mild degenerative changes. Orbits: No orbital fractures. No evidence of intraorbital hemorrhage. Sinuses: Mucous retention cyst or polyp involving the LEFT maxillary sinus. Minimal mucosal thickening involving the RIGHT maxillary sinus. Opacification of BILATERAL ethmoid air cells. Frontal sinuses well aerated. Small benign osteoma involving the LEFT frontal sinus. Small mucous retention cyst or polyp in the RIGHT sphenoid sinus. LEFT sphenoid sinus well aerated. Visualized BILATERAL mastoid air cells and BILATERAL middle ear cavities well-aerated. Bony nasal septal deviation to the LEFT. RIGHT concha bullosa. Soft tissues: Mild soft tissue swelling/ecchymosis involving the cheeks bilaterally. No evidence of soft tissue hematoma. Limited intracranial: Unremarkable. IMPRESSION: 1.  Nondisplaced bilateral nasal bone fractures. 2. No facial bone fractures elsewhere. 3. Mild chronic bilateral maxillary, bilateral ethmoid and right sphenoid sinusitis. 4. Bony nasal septal deviation to the left. Right concha bullosa. Electronically Signed   By: Hulan Saas M.D.   On: 10/06/2018 13:06    ____________________________________________   PROCEDURES  Procedure(s) performed (including Critical  Care):  Procedures   ____________________________________________   INITIAL IMPRESSION / ASSESSMENT AND PLAN / ED COURSE  As part of my medical decision making, I reviewed the following data within the electronic MEDICAL RECORD NUMBER    Patient presents with facial and finger pain secondary to a fall yesterday.  Facial CT shows a nondisplaced nasal fracture but no other facial injuries.  Patient x-ray reveals no fracture of the fifth finger left hand.  Discussed CT x-ray findings with patient.  Patient will follow ENT clinic in 3 days.  Patient given discharge care instructions and finger placed in a splint.       ____________________________________________   FINAL CLINICAL IMPRESSION(S) / ED DIAGNOSES  Final diagnoses:  Closed fracture of nasal bone, initial encounter  Sprain of left little finger, unspecified site of finger, initial encounter  Contusion of face, initial encounter     ED Discharge Orders         Ordered    traMADol-acetaminophen (ULTRACET) 37.5-325 MG tablet  Every 8 hours PRN     10/06/18 1323           Note:  This document was prepared using Dragon voice recognition software and may include unintentional dictation errors.    Joni Reining, PA-C 10/06/18 1329    Jeanmarie Plant, MD 10/06/18 1535

## 2018-11-09 IMAGING — MG MM DIGITAL SCREENING BILAT W/ TOMO W/ CAD
8 of 13 series · 8 of 29 positions shown · non-contrast
Comparison: Previous exam(s).

CLINICAL DATA: Screening.

EXAM:
2D DIGITAL SCREENING BILATERAL MAMMOGRAM WITH CAD AND ADJUNCT TOMO

[R CC (1 of 2)]
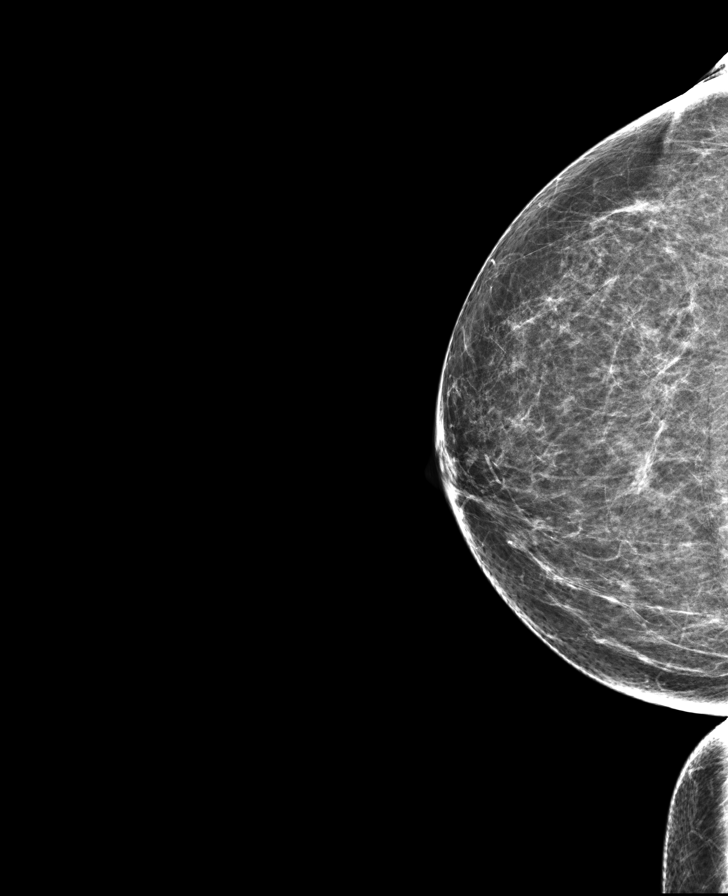

[R CC (2 of 2)]
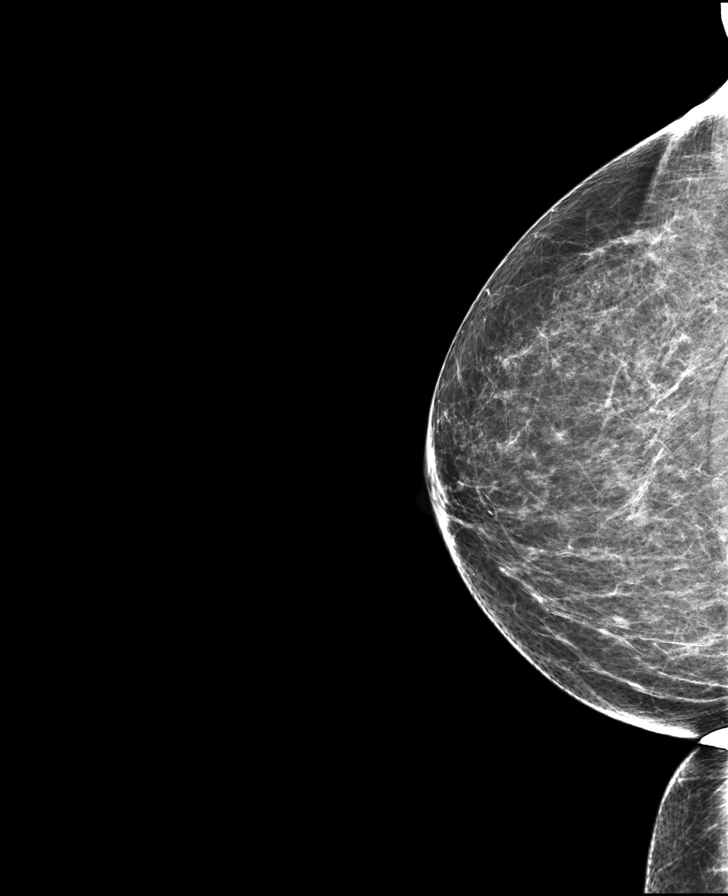

[L CC]
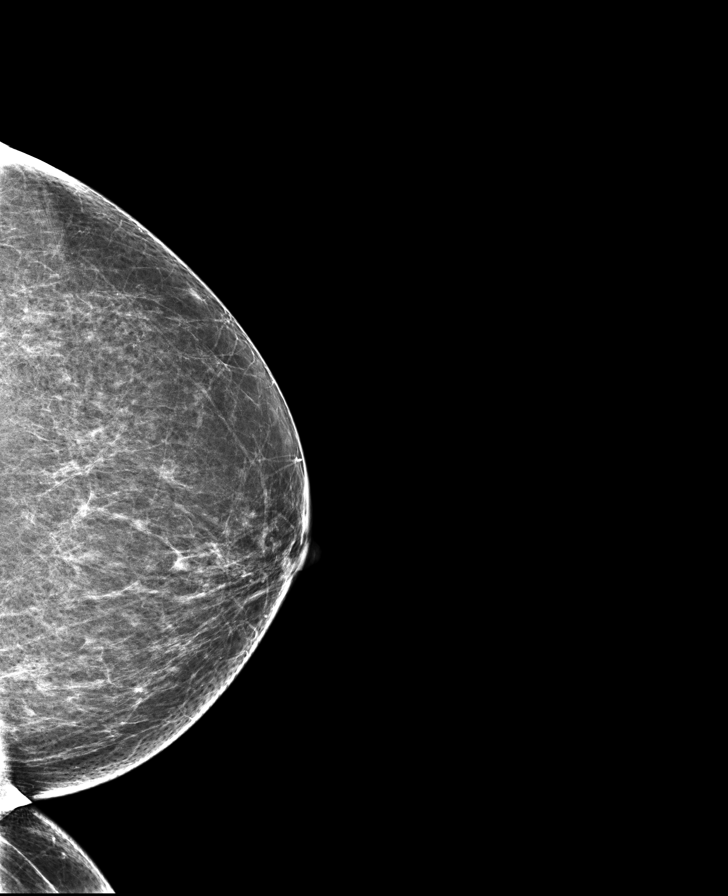

[L CC synth-2D]
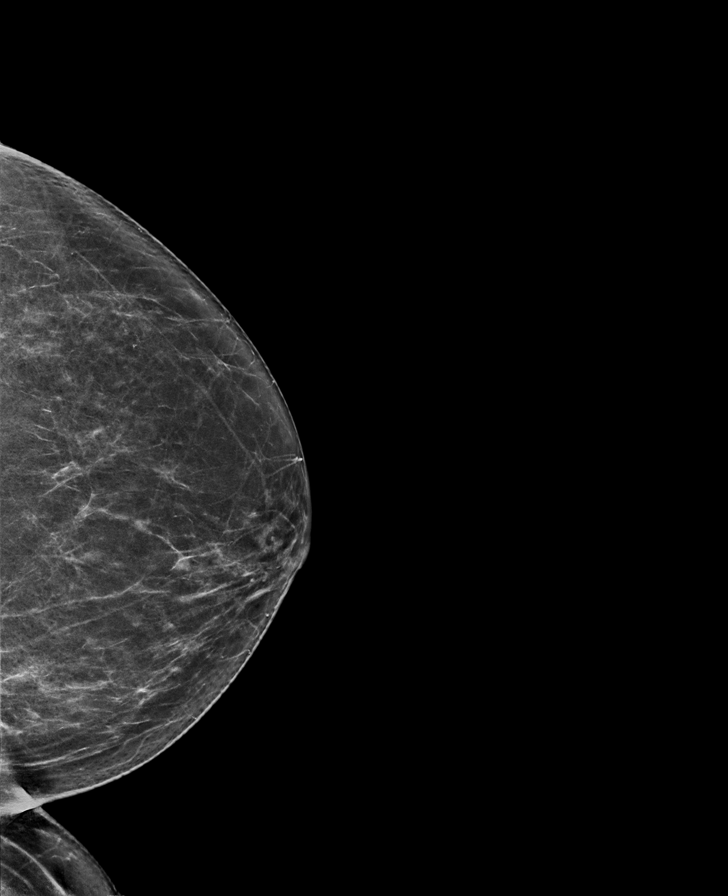

[L MLO]
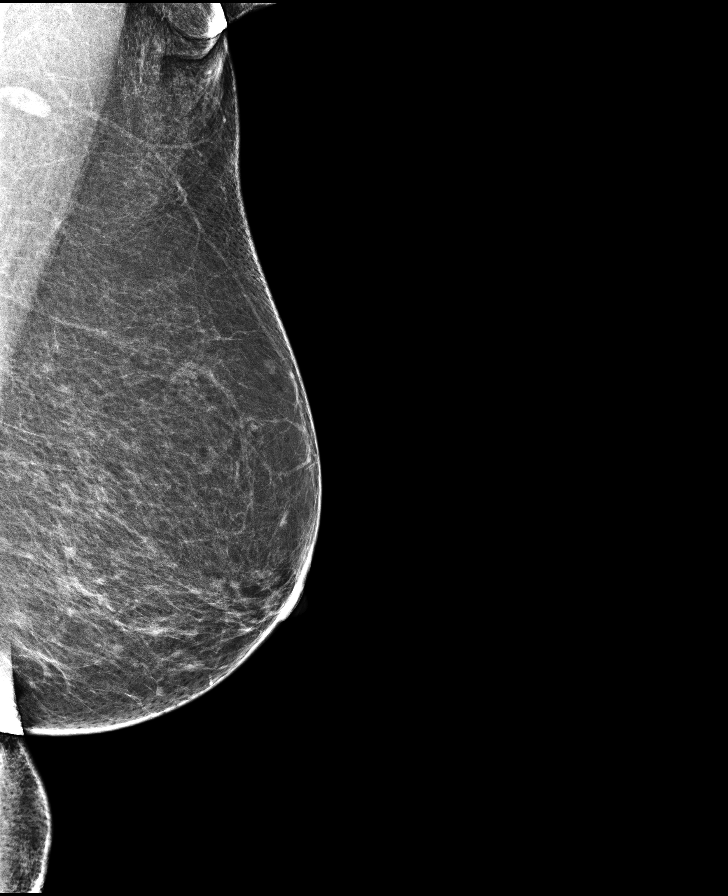

[R CC synth-2D]
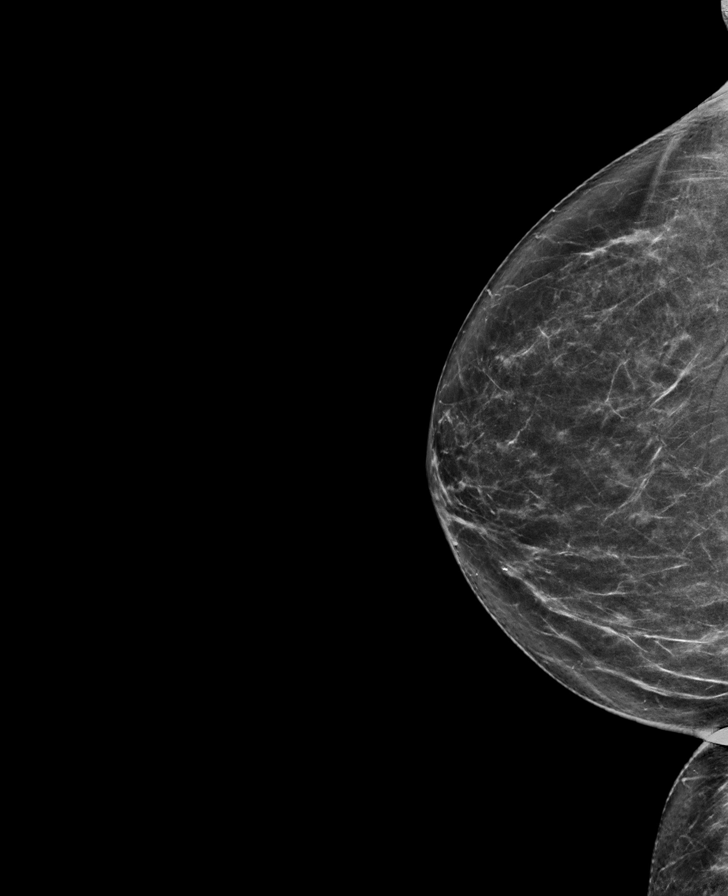

[R MLO]
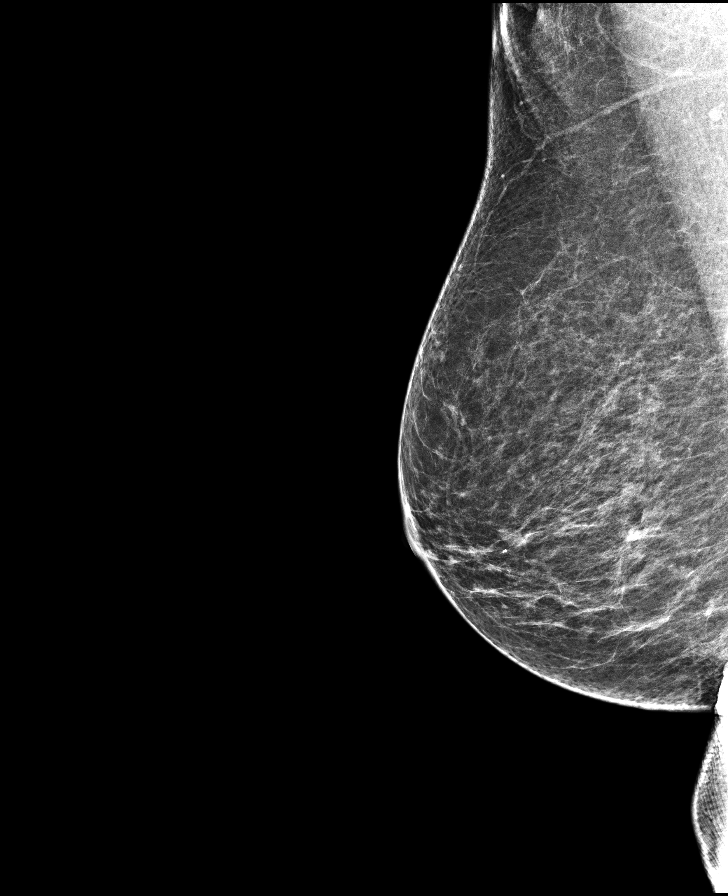

[R MLO synth-2D]
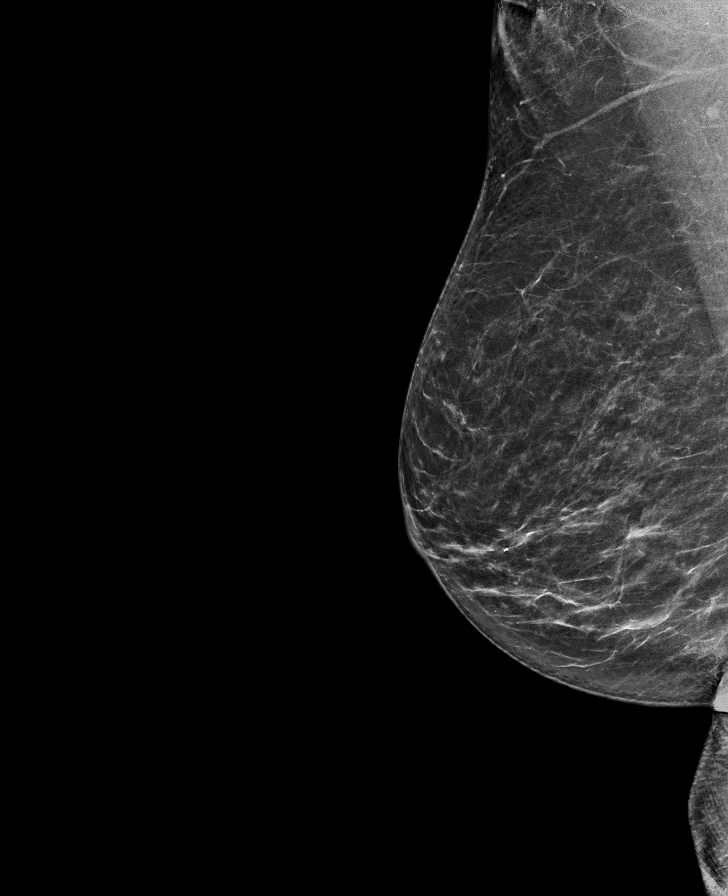

[8 of 29 positions shown; findings below may reference images not displayed]

ACR Breast Density Category b: There are scattered areas of
fibroglandular density.
FINDINGS: There are no findings suspicious for malignancy. Images were
processed with CAD.
IMPRESSION: No mammographic evidence of malignancy. A result letter of this
screening mammogram will be mailed directly to the patient.

RECOMMENDATION:
Screening mammogram in one year. (Code:97-6-RS4)

BI-RADS CATEGORY  1: Negative.

## 2018-12-13 ENCOUNTER — Other Ambulatory Visit: Payer: Medicare Other

## 2019-02-28 ENCOUNTER — Other Ambulatory Visit: Payer: Medicare Other

## 2019-04-04 ENCOUNTER — Ambulatory Visit
Admission: RE | Admit: 2019-04-04 | Discharge: 2019-04-04 | Disposition: A | Discharge status: Home / Self Care | Payer: Medicare Other | Source: Ambulatory Visit | Attending: Family Medicine | Admitting: Family Medicine

## 2019-04-04 DIAGNOSIS — Z780 Asymptomatic menopausal state: Secondary | ICD-10-CM | POA: Insufficient documentation

## 2019-04-04 DIAGNOSIS — Z1231 Encounter for screening mammogram for malignant neoplasm of breast: Secondary | ICD-10-CM | POA: Insufficient documentation

## 2020-04-11 ENCOUNTER — Other Ambulatory Visit: Payer: Self-pay | Admitting: Family Medicine

## 2020-04-11 DIAGNOSIS — Z1231 Encounter for screening mammogram for malignant neoplasm of breast: Secondary | ICD-10-CM

## 2020-05-07 ENCOUNTER — Other Ambulatory Visit: Payer: Self-pay

## 2020-05-07 ENCOUNTER — Ambulatory Visit
Admission: RE | Admit: 2020-05-07 | Discharge: 2020-05-07 | Disposition: A | Discharge status: Home / Self Care | Payer: Medicare Other | Source: Ambulatory Visit | Attending: Family Medicine | Admitting: Family Medicine

## 2020-05-07 DIAGNOSIS — Z1231 Encounter for screening mammogram for malignant neoplasm of breast: Secondary | ICD-10-CM | POA: Diagnosis present

## 2020-07-14 ENCOUNTER — Ambulatory Visit
Admission: EM | Admit: 2020-07-14 | Discharge: 2020-07-14 | Disposition: A | Discharge status: Home / Self Care | Payer: Medicare Other | Attending: Physician Assistant | Admitting: Physician Assistant

## 2020-07-14 ENCOUNTER — Other Ambulatory Visit: Payer: Self-pay

## 2020-07-14 ENCOUNTER — Encounter: Payer: Self-pay | Admitting: Emergency Medicine

## 2020-07-14 DIAGNOSIS — Z7901 Long term (current) use of anticoagulants: Secondary | ICD-10-CM | POA: Insufficient documentation

## 2020-07-14 DIAGNOSIS — Z20822 Contact with and (suspected) exposure to covid-19: Secondary | ICD-10-CM | POA: Insufficient documentation

## 2020-07-14 DIAGNOSIS — J069 Acute upper respiratory infection, unspecified: Secondary | ICD-10-CM | POA: Diagnosis not present

## 2020-07-14 DIAGNOSIS — J45909 Unspecified asthma, uncomplicated: Secondary | ICD-10-CM | POA: Insufficient documentation

## 2020-07-14 DIAGNOSIS — Z79899 Other long term (current) drug therapy: Secondary | ICD-10-CM | POA: Insufficient documentation

## 2020-07-14 DIAGNOSIS — I10 Essential (primary) hypertension: Secondary | ICD-10-CM | POA: Insufficient documentation

## 2020-07-14 DIAGNOSIS — J029 Acute pharyngitis, unspecified: Secondary | ICD-10-CM

## 2020-07-14 DIAGNOSIS — R059 Cough, unspecified: Secondary | ICD-10-CM | POA: Diagnosis not present

## 2020-07-14 DIAGNOSIS — E785 Hyperlipidemia, unspecified: Secondary | ICD-10-CM | POA: Diagnosis not present

## 2020-07-14 DIAGNOSIS — R0981 Nasal congestion: Secondary | ICD-10-CM | POA: Insufficient documentation

## 2020-07-14 DIAGNOSIS — J101 Influenza due to other identified influenza virus with other respiratory manifestations: Secondary | ICD-10-CM | POA: Insufficient documentation

## 2020-07-14 LAB — RESP PANEL BY RT-PCR (FLU A&B, COVID) ARPGX2
Influenza A by PCR: POSITIVE — AB
Influenza B by PCR: NEGATIVE
SARS Coronavirus 2 by RT PCR: NEGATIVE

## 2020-07-14 LAB — GROUP A STREP BY PCR: Group A Strep by PCR: NOT DETECTED

## 2020-07-14 MED ORDER — LIDOCAINE VISCOUS HCL 2 % MT SOLN: 15.0000 mL | OROMUCOSAL | 0 refills | Status: AC | PRN

## 2020-07-14 MED ORDER — OSELTAMIVIR PHOSPHATE 75 MG PO CAPS: 75.0000 mg | ORAL_CAPSULE | Freq: Two times a day (BID) | ORAL | 0 refills | Status: AC

## 2020-07-14 MED ORDER — PSEUDOEPH-BROMPHEN-DM 30-2-10 MG/5ML PO SYRP: 5.0000 mL | ORAL_SOLUTION | Freq: Four times a day (QID) | ORAL | 0 refills | Status: AC | PRN

## 2020-07-14 MED ORDER — IPRATROPIUM BROMIDE 0.06 % NA SOLN: 2.0000 | Freq: Four times a day (QID) | NASAL | 12 refills | Status: AC

## 2020-07-14 NOTE — ED Triage Notes (Signed)
Patient in today c/o sore throat, cough and runny nose x 1 days. Patient denies fever. Patient has taken OTC aspirin for her symptoms. Patient has had covid vaccines. Patient declines covid testing today.

## 2020-07-14 NOTE — Discharge Instructions (Signed)

## 2020-07-14 NOTE — ED Provider Notes (Signed)
[facility]    CSN: [id number] Arrival date & time: 07/14/20  1245      History   Chief Complaint Chief Complaint  Patient presents with  . Sore Throat  . Cough  . Nasal Congestion    HPI Laurie Adkins is a 79 y.o. female presenting for 1 day history of sore throat, cough and runny nose.  She says that her throat pain is moderate to severe.  Denies any associated fever.  Does admit to fatigue and body aches.  She denies any sinus pain, ear pain, headaches, dizziness, chest pain, breathing difficulty or wheezing, abdominal pain, nausea/vomiting or diarrhea, changes in smell or taste.  She states she has been taking over-the-counter aspirin for symptoms.  She is fully vaccinated for COVID-19 and denies any COVID-19, influenza, or strep exposure.  Past medical history is significant for asthma, hypertension, and hyperlipidemia.  No other complaints or concerns at this time.  HPI  Past Medical History:  Diagnosis Date  . Asthma   . GERD (gastroesophageal reflux disease)   . Gout   . Hyperlipemia   . Hypertension     There are no problems to display for this patient.   Past Surgical History:  Procedure Laterality Date  . ABDOMINAL HYSTERECTOMY    . CATARACT EXTRACTION W/PHACO Right 02/02/2017   Procedure: CATARACT EXTRACTION PHACO AND INTRAOCULAR LENS PLACEMENT (IOC)  right;  Surgeon: Lockie Mola, MD;  Location: [facility];  Service: Ophthalmology;  Laterality: Right;  . CATARACT EXTRACTION W/PHACO Left 06/01/2017   Procedure: CATARACT EXTRACTION PHACO AND INTRAOCULAR LENS PLACEMENT (IOC) LEFT;  Surgeon: Lockie Mola, MD;  Location: [facility];  Service: Ophthalmology;  Laterality: Left;    OB History   No obstetric history on file.      Home Medications    Prior to Admission medications   Medication Sig Start Date End Date Taking? Authorizing Provider  allopurinol (ZYLOPRIM) 100 MG tablet Take 100 mg by mouth  daily.   Yes [provider]  aspirin EC 81 MG tablet Take 81 mg by mouth daily.   Yes [provider]  Ipratropium-Albuterol (COMBIVENT) 20-100 MCG/ACT AERS respimat Inhale 1 puff into the lungs every 6 (six) hours as needed for wheezing.   Yes [provider]  losartan (COZAAR) 25 MG tablet Take 25 mg by mouth daily.   Yes [provider]  metoprolol succinate (TOPROL-XL) 25 MG 24 hr tablet Take 25 mg by mouth daily.   Yes [provider]  Multiple Vitamin (MULTIVITAMIN) tablet Take 1 tablet by mouth daily.   Yes [provider]  pravastatin (PRAVACHOL) 40 MG tablet Take 40 mg by mouth daily.   Yes [provider]  ranitidine (ZANTAC) 150 MG capsule Take 150 mg by mouth 2 (two) times daily.   Yes [provider]  vitamin E 600 UNIT capsule Take 600 Units by mouth daily.   Yes [provider]  brompheniramine-pseudoephedrine-DM 30-2-10 MG/5ML syrup Take 5 mLs by mouth 4 (four) times daily as needed for up to 7 days. 07/14/20 07/21/20  Eusebio Friendly B, PA-C  ipratropium (ATROVENT) 0.06 % nasal spray Place 2 sprays into both nostrils 4 (four) times daily for 7 days. 07/14/20 07/21/20  Eusebio Friendly B, PA-C  lidocaine (XYLOCAINE) 2 % solution Use as directed 15 mLs in the mouth or throat as needed for up to 5 days for mouth pain (swish and spit q2h throat pain). 07/14/20 07/19/20  Shirlee Latch, PA-C  traMADol-acetaminophen (ULTRACET) 37.5-325 MG tablet Take 1 tablet by mouth every 8 (eight) hours as needed. 10/06/18   Joni Reining, PA-C    Family History Family History  Problem Relation Age of Onset  . Breast cancer Sister 48  . Stroke Mother   . Other Father 3       "old age"    Social History Social History   Tobacco Use  . Smoking status: Never Smoker  . Smokeless tobacco: Never Used  Vaping Use  . Vaping Use: Never used  Substance Use Topics  . Alcohol use: No  . Drug use: Never     Allergies    Amoxicillin, Bee venom, Colchicine, and Penicillins   Review of Systems Review of Systems  Constitutional: Positive for fatigue. Negative for chills, diaphoresis and fever.  HENT: Positive for congestion, rhinorrhea and sore throat. Negative for ear pain, sinus pressure and sinus pain.   Respiratory: Positive for cough. Negative for shortness of breath.   Gastrointestinal: Negative for abdominal pain, nausea and vomiting.  Musculoskeletal: Positive for myalgias. Negative for arthralgias.  Skin: Negative for rash.  Neurological: Negative for weakness and headaches.  Hematological: Negative for adenopathy.     Physical Exam Triage Vital Signs ED Triage Vitals  Enc Vitals Group     BP 07/14/20 1306 128/61     Pulse Rate 07/14/20 1306 87     Resp 07/14/20 1306 18     Temp 07/14/20 1306 98.4 F (36.9 C)     Temp Source 07/14/20 1306 Oral     SpO2 07/14/20 1306 95 %     Weight 07/14/20 1307 130 lb (59 kg)     Height 07/14/20 1307 4\' 11"  (1.499 m)     Head Circumference --      Peak Flow --      Pain Score 07/14/20 1305 9     Pain Loc --      Pain Edu? --      Excl. in GC? --    No data found.  Updated Vital Signs BP 128/61 (BP Location: Left Arm)   Pulse 87   Temp 98.4 F (36.9 C) (Oral)   Resp 18   Ht 4\' 11"  (1.499 m)   Wt 130 lb (59 kg)   SpO2 95%   BMI 26.26 kg/m           Physical Exam Vitals and nursing note reviewed.  Constitutional:      General: She is not in acute distress.    Appearance: Normal appearance. She is not ill-appearing or toxic-appearing.  HENT:     Head: Normocephalic and atraumatic.     Nose: Rhinorrhea (trace clear drainage) present.     Mouth/Throat:     Mouth: Mucous membranes are moist.     Pharynx: Oropharynx is clear. Posterior oropharyngeal erythema present.  Eyes:     General: No scleral icterus.       Right eye: No discharge.        Left eye: No discharge.     Conjunctiva/sclera: Conjunctivae normal.  Cardiovascular:      Rate and Rhythm: Normal rate and regular rhythm.     Heart sounds: Normal heart sounds.  Pulmonary:     Effort: Pulmonary effort is normal. No respiratory distress.     Breath sounds: Normal breath sounds.  Musculoskeletal:     Cervical back: Neck supple.  Skin:    General: Skin is dry.  Neurological:     General: No  focal deficit present.     Mental Status: She is alert. Mental status is at baseline.     Motor: No weakness.     Gait: Gait normal.  Psychiatric:        Mood and Affect: Mood normal.        Behavior: Behavior normal.        Thought Content: Thought content normal.      UC Treatments / Results  Labs (all labs ordered are listed, but only abnormal results are displayed) Labs Reviewed  GROUP A STREP BY PCR  RESP PANEL BY RT-PCR (FLU A&B, COVID) [id number]    EKG   Radiology No results found.  Procedures Procedures (including critical care time)  Medications Ordered in UC Medications - No data to display  Initial Impression / Assessment and Plan / UC Course  I have reviewed the triage vital signs and the nursing notes.  Pertinent labs & imaging results that were available during my care of the patient were reviewed by me and considered in my medical decision making (see chart for details).   79 year old female presenting for 1 day history of sore throat, cough and nasal congestion.  All vital signs normal and stable.  She is afebrile.  No acute distress.  Exam reveals mild nasal congestion and clear rhinorrhea and mild posterior pharyngeal erythema.  Chest is clear to auscultation.  Patient initially declines Covid or flu testing.  Molecular strep testing obtained.  Molecular strep test negative.  Advised patient of negative result and advised her to get the respiratory panel.  She is agreeable at this time.  Advised patient we will call with results.  At this time, advised supportive care with increasing rest and fluids.  I have sent a low-dose of Bromfed,  Atrovent nasal spray, and viscous lidocaine for symptoms.  ED precautions reviewed.  Influenza A positive.  Sent Tamiflu to pharmacy.  Final Clinical Impressions(s) / UC Diagnoses   Final diagnoses:  Viral upper respiratory tract infection  Sore throat  Cough     Discharge Instructions     URI/COLD SYMPTOMS: Your exam today is consistent with a viral illness. Antibiotics are not indicated at this time. Use medications as directed, including cough syrup, nasal saline, and decongestants. Your symptoms should improve over the next few days and resolve within 7-10 days. Increase rest and fluids. F/u if symptoms worsen or predominate such as sore throat, ear pain, productive cough, shortness of breath, or if you develop high fevers or worsening fatigue over the next several days.    You have received COVID testing today either for positive exposure, concerning symptoms that could be related to COVID infection, screening purposes, or re-testing after confirmed positive.  Your test obtained today checks for active viral infection in the last 1-2 weeks. If your test is negative now, you can still test positive later. So, if you do develop symptoms you should either get re-tested and/or isolate x 10 days. Please follow CDC guidelines.  While Rapid antigen tests come back in 15-20 minutes, send out PCR/molecular test results typically come back within 24 hours. In the mean time, if you are symptomatic, assume this could be a positive test and treat/monitor yourself as if you do have COVID.   We will call with test results. Please download the MyChart app and set up a profile to access test results.   If symptomatic, go home and rest. Push fluids. Take Tylenol as needed for discomfort. Gargle warm salt water. Throat  lozenges. Take Mucinex DM or Robitussin for cough. Humidifier in bedroom to ease coughing. Warm showers. Also review the COVID handout for more information.  COVID-19 INFECTION: The  incubation period of COVID-19 is approximately 14 days after exposure, with most symptoms developing in roughly 4-5 days. Symptoms may range in severity from mild to critically severe. Roughly 80% of those infected will have mild symptoms. People of any age may become infected with COVID-19 and have the ability to transmit the virus. The most common symptoms include: fever, fatigue, cough, body aches, headaches, sore throat, nasal congestion, shortness of breath, nausea, vomiting, diarrhea, changes in smell and/or taste.    COURSE OF ILLNESS Some patients may begin with mild disease which can progress quickly into critical symptoms. If your symptoms are worsening please call ahead to the Emergency Department and proceed there for further treatment. Recovery time appears to be roughly 1-2 weeks for mild symptoms and 3-6 weeks for severe disease.   GO IMMEDIATELY TO ER FOR FEVER YOU ARE UNABLE TO GET DOWN WITH TYLENOL, BREATHING PROBLEMS, CHEST PAIN, FATIGUE, LETHARGY, INABILITY TO EAT OR DRINK, ETC  QUARANTINE AND ISOLATION: To help decrease the spread of COVID-19 please remain isolated if you have COVID infection or are highly suspected to have COVID infection. This means -stay home and isolate to one room in the home if you live with others. Do not share a bed or bathroom with others while ill, sanitize and wipe down all countertops and keep common areas clean and disinfected. You may discontinue isolation if you have a mild case and are asymptomatic 10 days after symptom onset as long as you have been fever free >24 hours without having to take Motrin or Tylenol. If your case is more severe (meaning you develop pneumonia or are admitted in the hospital), you may have to isolate longer.   If you have been in close contact (within 6 feet) of someone diagnosed with COVID 19, you are advised to quarantine in your home for 14 days as symptoms can develop anywhere from 2-14 days after exposure to the virus. If  you develop symptoms, you  must isolate.  Most current guidelines for COVID after exposure -isolate 10 days if you ARE NOT tested for COVID as long as symptoms do not develop -isolate 7 days if you are tested and remain asymptomatic -You do not necessarily need to be tested for COVID if you have + exposure and        develop   symptoms. Just isolate at home x10 days from symptom onset During this global pandemic, CDC advises to practice social distancing, try to stay at least 24ft away from others at all times. Wear a face covering. Wash and sanitize your hands regularly and avoid going anywhere that is not necessary.  KEEP IN MIND THAT THE COVID TEST IS NOT 100% ACCURATE AND YOU SHOULD STILL DO EVERYTHING TO PREVENT POTENTIAL SPREAD OF VIRUS TO OTHERS (WEAR MASK, WEAR GLOVES, WASH HANDS AND SANITIZE REGULARLY). IF INITIAL TEST IS NEGATIVE, THIS MAY NOT MEAN YOU ARE DEFINITELY NEGATIVE. MOST ACCURATE TESTING IS DONE 5-7 DAYS AFTER EXPOSURE.   It is not advised by CDC to get re-tested after receiving a positive COVID test since you can still test positive for weeks to months after you have already cleared the virus.   *If you have not been vaccinated for COVID, I strongly suggest you consider getting vaccinated as long as there are no contraindications.      ED  Prescriptions    Medication Sig Dispense Auth. Provider   brompheniramine-pseudoephedrine-DM 30-2-10 MG/5ML syrup Take 5 mLs by mouth 4 (four) times daily as needed for up to 7 days. 150 mL Eusebio Friendly B, PA-C   lidocaine (XYLOCAINE) 2 % solution Use as directed 15 mLs in the mouth or throat as needed for up to 5 days for mouth pain (swish and spit q2h throat pain). 120 mL Eusebio Friendly B, PA-C   ipratropium (ATROVENT) 0.06 % nasal spray Place 2 sprays into both nostrils 4 (four) times daily for 7 days. 15 mL Shirlee Latch, PA-C     PDMP not reviewed this encounter.   Shirlee Latch, PA-C 07/14/20 1548

## 2021-06-24 ENCOUNTER — Other Ambulatory Visit: Payer: Self-pay | Admitting: Family Medicine

## 2021-06-24 DIAGNOSIS — Z1231 Encounter for screening mammogram for malignant neoplasm of breast: Secondary | ICD-10-CM

## 2021-07-27 ENCOUNTER — Ambulatory Visit
Admission: RE | Admit: 2021-07-27 | Discharge: 2021-07-27 | Disposition: A | Discharge status: Home / Self Care | Payer: Medicare Other | Source: Ambulatory Visit | Attending: Family Medicine | Admitting: Family Medicine

## 2021-07-27 ENCOUNTER — Other Ambulatory Visit: Payer: Self-pay

## 2021-07-27 DIAGNOSIS — Z1231 Encounter for screening mammogram for malignant neoplasm of breast: Secondary | ICD-10-CM | POA: Insufficient documentation

## 2022-07-14 ENCOUNTER — Encounter: Payer: Medicare Other | Admitting: Advanced Practice Midwife

## 2022-07-20 ENCOUNTER — Encounter: Payer: Self-pay | Admitting: Obstetrics & Gynecology

## 2022-07-20 ENCOUNTER — Ambulatory Visit (INDEPENDENT_AMBULATORY_CARE_PROVIDER_SITE_OTHER): Payer: Medicare Other | Admitting: Obstetrics & Gynecology

## 2022-07-20 VITALS — BP 151/77 | HR 72 | Ht 59.0 in | Wt 125.0 lb

## 2022-07-20 DIAGNOSIS — R82998 Other abnormal findings in urine: Secondary | ICD-10-CM | POA: Diagnosis not present

## 2022-07-20 DIAGNOSIS — R8290 Unspecified abnormal findings in urine: Secondary | ICD-10-CM | POA: Diagnosis not present

## 2022-07-20 LAB — POCT URINALYSIS DIPSTICK
Bilirubin, UA: NEGATIVE
Blood, UA: NEGATIVE
Glucose, UA: NEGATIVE
Ketones, UA: NEGATIVE
Nitrite, UA: NEGATIVE
Protein, UA: NEGATIVE
Spec Grav, UA: 1.02 (ref 1.010–1.025)
Urobilinogen, UA: 0.2 E.U./dL
pH, UA: 7 (ref 5.0–8.0)

## 2022-07-20 NOTE — Progress Notes (Signed)
Patient here for a referral from her PCP for "cloudy urine". They did not do a UA. Patient denies any dysuria. BP recheck, 134/80.

## 2022-07-20 NOTE — Progress Notes (Signed)
   Established Patient Office Visit  Subjective   Patient ID: Laurie Adkins, female    DOB: 04-30-1941  Age: 81 y.o. MRN: [id number]  Chief Complaint  Patient presents with   Referral    Urinary issues, "not clear urine"    HPI   This lovely 81 yo divorced P2 is here as a new patient with the complaint of dark urine. She denies dysuria, incontinence, or any urinary tract complaints. She has been abstinent for years. She had a hysterectomy and her ovaries have been removed.  Objective:     BP (!) 151/77   Pulse 72   Ht 4\' 11"  (1.499 m)   Wt 125 lb (56.7 kg)   BMI 25.25 kg/m    Physical Exam   Results for orders placed or performed in visit on 07/20/22  POCT Urinalysis Dipstick  Result Value Ref Range   Color, UA     Clarity, UA clear    Glucose, UA Negative Negative   Bilirubin, UA neg    Ketones, UA neg    Spec Grav, UA 1.020 1.010 - 1.025   Blood, UA neg    pH, UA 7.0 5.0 - 8.0   Protein, UA Negative Negative   Urobilinogen, UA 0.2 0.2 or 1.0 E.U./dL   Nitrite, UA neg    Leukocytes, UA Small (1+) (A) Negative   Appearance     Odor        Well nourished, well hydrated White female, no apparent distress She is ambulating and conversing normally. I offered to do a pelvic exam but she declines.  Assessment & Plan:   Problem List Items Addressed This Visit   None Visit Diagnoses     Cloudy urine    -  Primary   Relevant Orders   POCT Urinalysis Dipstick (Completed)   Urine Culture      Dark urine- I have advised her that if she wants her urine to look less dark, then she needs to drink more water. I will send a urine culture and call her with the results.    14/12/23, MD

## 2022-07-22 LAB — URINE CULTURE

## 2022-10-18 ENCOUNTER — Other Ambulatory Visit: Payer: Self-pay | Admitting: Pediatrics

## 2022-10-18 DIAGNOSIS — Z1231 Encounter for screening mammogram for malignant neoplasm of breast: Secondary | ICD-10-CM

## 2022-10-26 ENCOUNTER — Ambulatory Visit
Admission: RE | Admit: 2022-10-26 | Discharge: 2022-10-26 | Disposition: A | Discharge status: Home / Self Care | Payer: Medicare Other | Source: Ambulatory Visit | Attending: Pediatrics | Admitting: Pediatrics

## 2022-10-26 DIAGNOSIS — Z1231 Encounter for screening mammogram for malignant neoplasm of breast: Secondary | ICD-10-CM | POA: Insufficient documentation

## 2023-07-27 ENCOUNTER — Other Ambulatory Visit: Payer: Self-pay

## 2023-07-27 ENCOUNTER — Emergency Department
Admission: EM | Admit: 2023-07-27 | Discharge: 2023-07-27 | Disposition: A | Discharge status: Home / Self Care | Payer: Medicare Other | Attending: Emergency Medicine | Admitting: Emergency Medicine

## 2023-07-27 DIAGNOSIS — J069 Acute upper respiratory infection, unspecified: Secondary | ICD-10-CM | POA: Diagnosis not present

## 2023-07-27 DIAGNOSIS — J45909 Unspecified asthma, uncomplicated: Secondary | ICD-10-CM | POA: Diagnosis not present

## 2023-07-27 DIAGNOSIS — I10 Essential (primary) hypertension: Secondary | ICD-10-CM | POA: Diagnosis not present

## 2023-07-27 DIAGNOSIS — R531 Weakness: Secondary | ICD-10-CM

## 2023-07-27 DIAGNOSIS — N390 Urinary tract infection, site not specified: Secondary | ICD-10-CM | POA: Diagnosis not present

## 2023-07-27 LAB — CBC
HCT: 41.6 % (ref 36.0–46.0)
Hemoglobin: 13.9 g/dL (ref 12.0–15.0)
MCH: 30.5 pg (ref 26.0–34.0)
MCHC: 33.4 g/dL (ref 30.0–36.0)
MCV: 91.2 fL (ref 80.0–100.0)
Platelets: 115 10*3/uL — ABNORMAL LOW (ref 150–400)
RBC: 4.56 MIL/uL (ref 3.87–5.11)
RDW: 12.5 % (ref 11.5–15.5)
WBC: 5.8 10*3/uL (ref 4.0–10.5)
nRBC: 0 % (ref 0.0–0.2)

## 2023-07-27 LAB — URINALYSIS, ROUTINE W REFLEX MICROSCOPIC
Bilirubin Urine: NEGATIVE
Glucose, UA: NEGATIVE mg/dL
Ketones, ur: 20 mg/dL — AB
Leukocytes,Ua: NEGATIVE
Nitrite: NEGATIVE
Protein, ur: 100 mg/dL — AB
Specific Gravity, Urine: 1.024 (ref 1.005–1.030)
pH: 5 (ref 5.0–8.0)

## 2023-07-27 LAB — BASIC METABOLIC PANEL
Anion gap: 11 (ref 5–15)
BUN: 17 mg/dL (ref 8–23)
CO2: 24 mmol/L (ref 22–32)
Calcium: 9.3 mg/dL (ref 8.9–10.3)
Chloride: 99 mmol/L (ref 98–111)
Creatinine, Ser: 0.8 mg/dL (ref 0.44–1.00)
GFR, Estimated: >60 mL/min (ref >60)
Glucose, Bld: 162 mg/dL — ABNORMAL HIGH (ref 70–99)
Potassium: 4.2 mmol/L (ref 3.5–5.1)
Sodium: 134 mmol/L — ABNORMAL LOW (ref 135–145)

## 2023-07-27 MED ORDER — CEFDINIR 300 MG PO CAPS
300.0000 mg | ORAL_CAPSULE | Freq: Once | ORAL | Status: AC
  Administered 2023-07-27: 300 mg via ORAL
  Filled 2023-07-27: qty 1

## 2023-07-27 MED ORDER — CEFPODOXIME PROXETIL 200 MG PO TABS: 200.0000 mg | ORAL_TABLET | Freq: Two times a day (BID) | ORAL | 0 refills | Status: AC

## 2023-07-27 NOTE — ED Provider Notes (Signed)
[facility] Provider Note   Event Date/Time   First MD Initiated Contact with Patient 07/27/23 1919     (approximate) History  Weakness  HPI Catoya Femrite is a 82 y.o. female with a past medical history of hypertension, hyperlipidemia, asthma, and GERD who presents complaining of generalized weakness that started this morning and states it feels like both legs just gave out and she went to the floor.  Patient states that she was unable to get herself up off the floor and crawled to get to the phone to call her caregiver who is at bedside.  Patient states that this has occurred in the past however she has never had significant weakness to the point where she cannot get back up.  Patient states she has been feeling somewhat ill recently with cough and sore throat. ROS: Patient currently denies any vision changes, tinnitus, difficulty speaking, facial droop, chest pain, shortness of breath, abdominal pain, nausea/vomiting/diarrhea, dysuria, or numbness/paresthesias in any extremity   Physical Exam  Triage Vital Signs: ED Triage Vitals  Encounter Vitals Group     BP 07/27/23 1624 (!) 161/55     Systolic BP Percentile --      Diastolic BP Percentile --      Pulse Rate 07/27/23 1624 86     Resp 07/27/23 1624 18     Temp 07/27/23 1624 98.9 F (37.2 C)     Temp Source 07/27/23 1624 Oral     SpO2 07/27/23 1624 96 %     Weight --      Height --      Head Circumference --      Peak Flow --      Pain Score 07/27/23 1623 0     Pain Loc --      Pain Education --      Exclude from Growth Chart --    Most recent vital signs: Vitals:   07/27/23 1624  BP: (!) 161/55  Pulse: 86  Resp: 18  Temp: 98.9 F (37.2 C)  SpO2: 96%   General: Awake, oriented x4. CV:  Good peripheral perfusion.  Resp:  Normal effort.  Abd:  No distention.  Other:  Elderly well-developed, well-nourished Caucasian female resting comfortably in no acute distress ED Results /  Procedures / Treatments  Labs (all labs ordered are listed, but only abnormal results are displayed) Labs Reviewed  BASIC METABOLIC PANEL - Abnormal; Notable for the following components:      Result Value   Sodium 134 (*)    Glucose, Bld 162 (*)    All other components within normal limits  CBC - Abnormal; Notable for the following components:   Platelets 115 (*)    All other components within normal limits  URINALYSIS, ROUTINE W REFLEX MICROSCOPIC - Abnormal; Notable for the following components:   Color, Urine YELLOW (*)    APPearance CLEAR (*)    Hgb urine dipstick SMALL (*)    Ketones, ur 20 (*)    Protein, ur 100 (*)    Bacteria, UA RARE (*)    All other components within normal limits  CBG MONITORING, ED   EKG ED ECG REPORT I, Merwyn Katos, the attending physician, personally viewed and interpreted this ECG. Date: 07/27/2023 EKG Time: 1634 Rate: 88 Rhythm: normal sinus rhythm QRS Axis: normal Intervals: normal ST/T Wave abnormalities: normal Narrative Interpretation: no evidence of acute ischemia PROCEDURES: Critical Care performed: No Procedures MEDICATIONS ORDERED IN ED: Medications  cefdinir (  OMNICEF) capsule 300 mg (has no administration in time range)   IMPRESSION / MDM / ASSESSMENT AND PLAN / ED COURSE  I reviewed the triage vital signs and the nursing notes.                             The patient is on the cardiac monitor to evaluate for evidence of arrhythmia and/or significant heart rate changes. Patient's presentation is most consistent with acute presentation with potential threat to life or bodily function. Patient is an 82 year old female who presents for generalized weakness and upper respiratory symptoms. Not Pregnant. Unlikely TOA, Ovarian Torsion, PID, gonorrhea/chlamydia. Low suspicion for Infected Urolithiasis, AAA, Cholecystitis, Pancreatitis, SBO, Appendicitis, or other acute abdomen.  Rx: Cefpodoxime 200 mg BID for 5 days Disposition:  Discharge home. SRP discussed. Advise follow up with primary care provider within 24-72 hours.   FINAL CLINICAL IMPRESSION(S) / ED DIAGNOSES   Final diagnoses:  Generalized weakness  Upper respiratory tract infection, unspecified type  Urinary tract infection without hematuria, site unspecified   Rx / DC Orders   ED Discharge Orders          Ordered    cefpodoxime (VANTIN) 200 MG tablet  2 times daily        07/27/23 2036           Note:  This document was prepared using Dragon voice recognition software and may include unintentional dictation errors.   Merwyn Katos, MD 07/27/23 2040

## 2023-07-27 NOTE — ED Triage Notes (Signed)
Patient states weakness that started this morning; states she felt like both legs "gave out" and she slid to the floor, denies hitting head.
